# Patient Record
Sex: Male | Born: 1990 | Race: White | Hispanic: No | Marital: Single | Smoking: Never smoker
Health system: Southern US, Community
[De-identification: ages and names within clinical notes are randomized; demographics above are authoritative.]

---

## 2009-02-01 ENCOUNTER — Emergency Department (HOSPITAL_COMMUNITY): Admission: EM | Admit: 2009-02-01 | Discharge: 2009-02-01 | Payer: Self-pay | Admitting: Emergency Medicine

## 2009-11-24 ENCOUNTER — Emergency Department (HOSPITAL_COMMUNITY)
Admission: EM | Admit: 2009-11-24 | Discharge: 2009-11-25 | Payer: Self-pay | Source: Home / Self Care | Admitting: Emergency Medicine

## 2010-07-29 ENCOUNTER — Emergency Department (HOSPITAL_COMMUNITY)
Admission: EM | Admit: 2010-07-29 | Discharge: 2010-07-29 | Payer: Self-pay | Source: Home / Self Care | Admitting: Emergency Medicine

## 2010-11-06 IMAGING — CR DG CHEST 2V
2 series · 2 of 2 positions shown · non-contrast
Comparison: None.

CLINICAL DATA: Chest pain.  Difficulty breathing.

CHEST - 2 VIEW

[w chest pa]
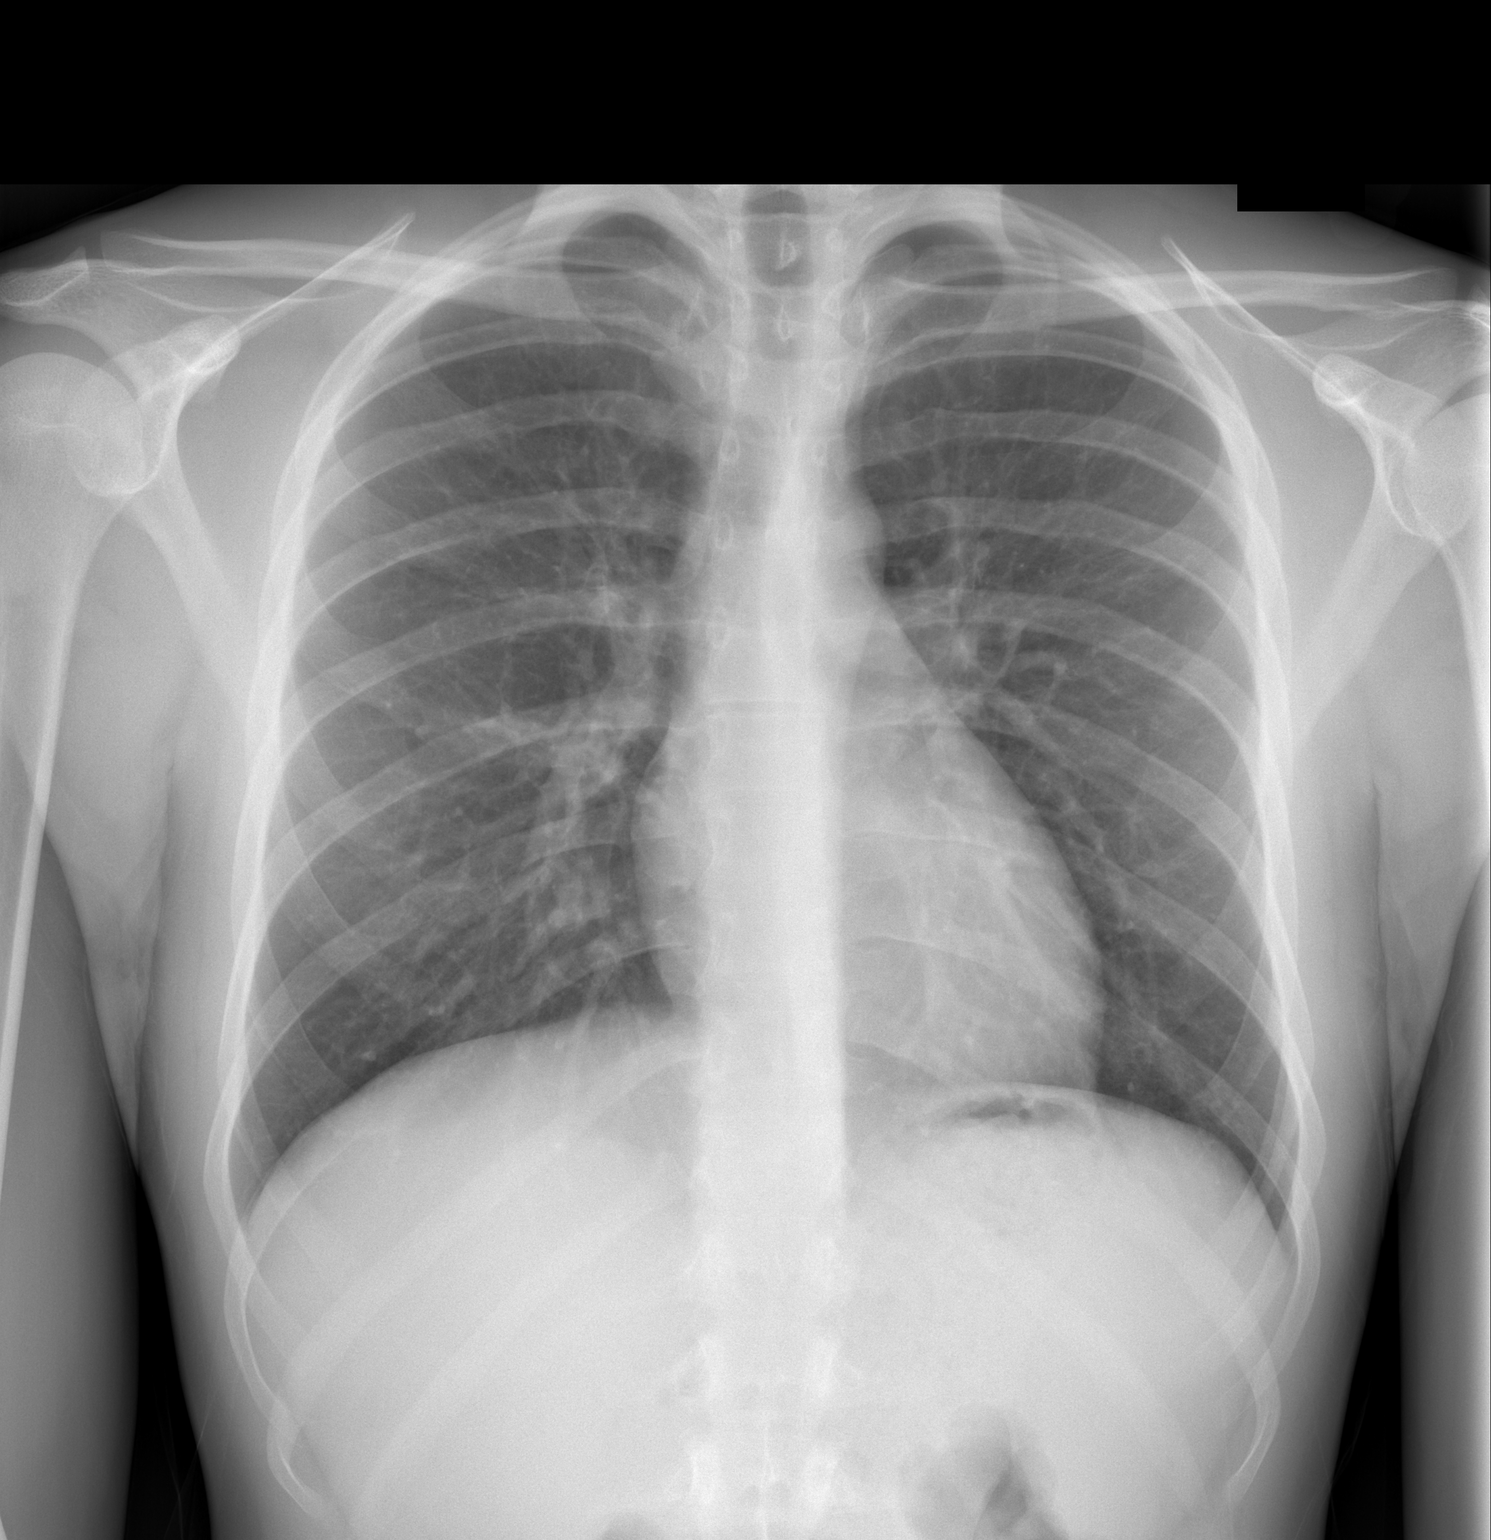

[w chest lat]
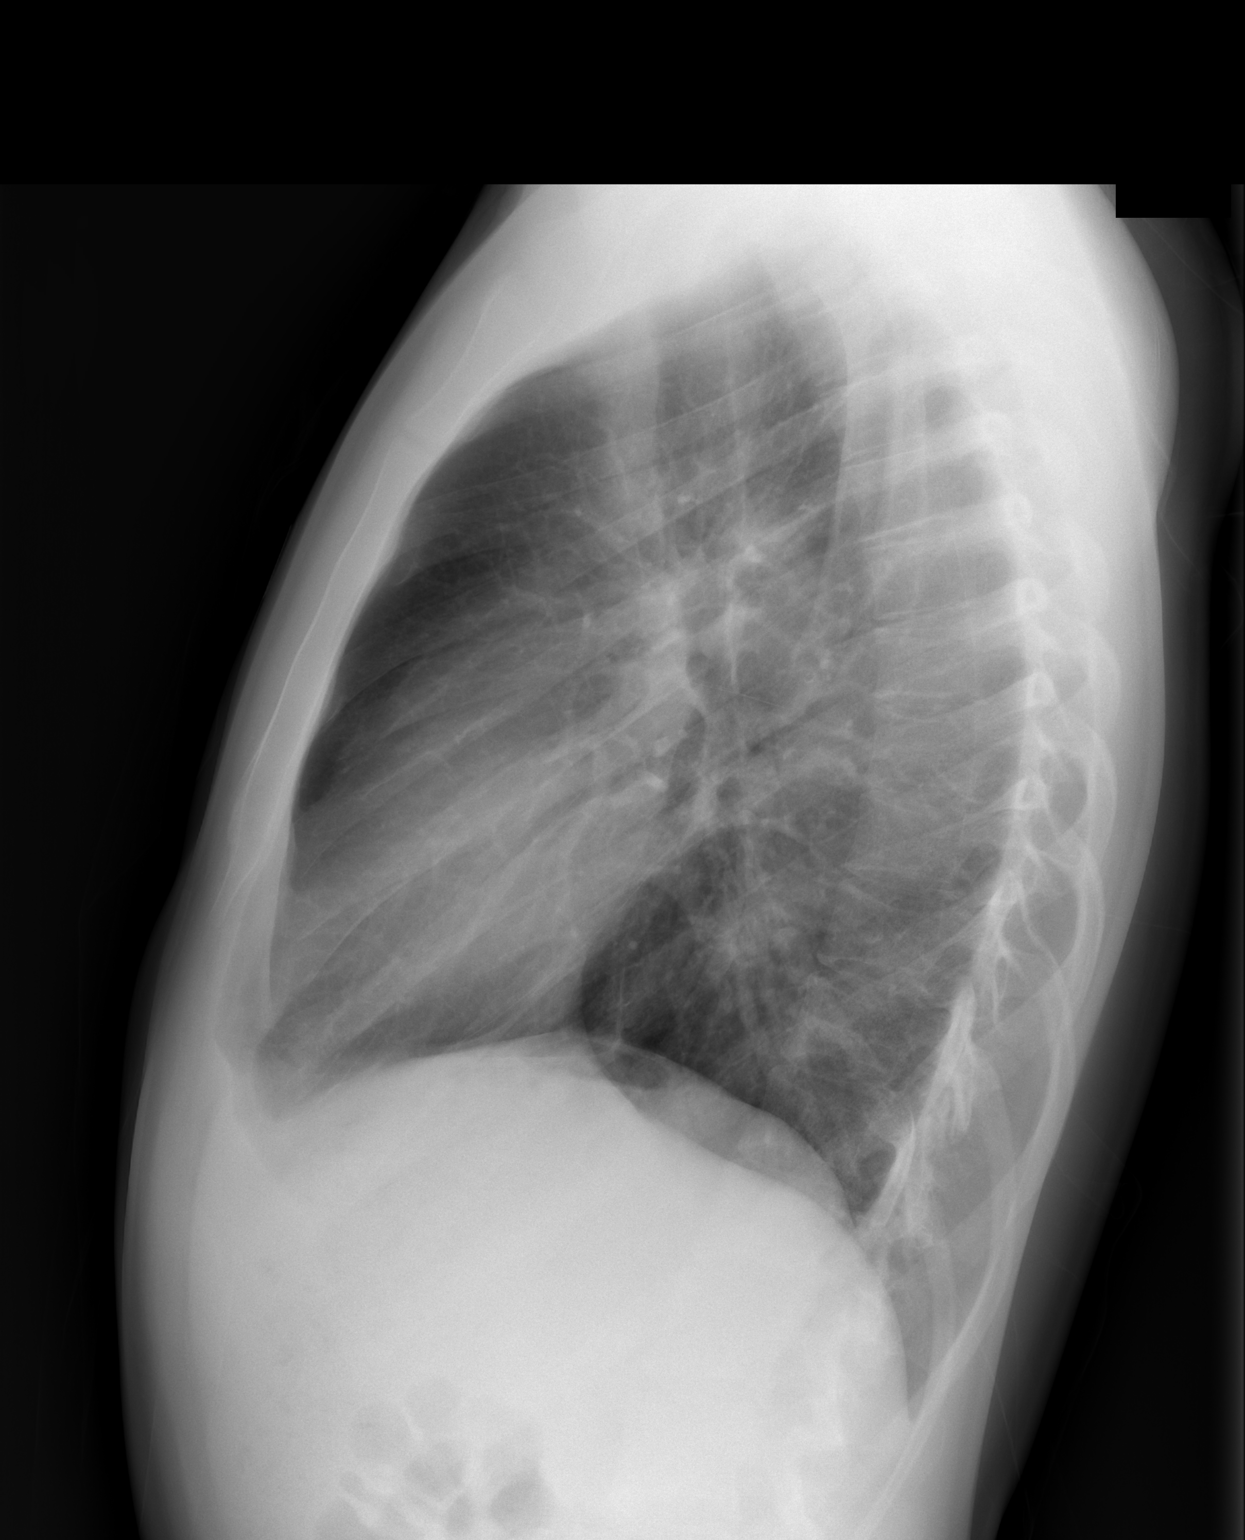

[2 of 2 positions shown; findings below may reference images not displayed]

FINDINGS: Lungs clear.  Cardiopericardial silhouette within normal
limits.  Trachea midline.  No airspace disease or effusion.
IMPRESSION: Normal chest.

## 2023-04-09 ENCOUNTER — Ambulatory Visit (HOSPITAL_COMMUNITY)
Admission: EM | Admit: 2023-04-09 | Discharge: 2023-04-09 | Disposition: A | Discharge status: Home / Self Care | Payer: Medicaid Other | Attending: Family | Admitting: Family

## 2023-04-09 DIAGNOSIS — F4322 Adjustment disorder with anxiety: Secondary | ICD-10-CM | POA: Insufficient documentation

## 2023-04-09 DIAGNOSIS — Z79899 Other long term (current) drug therapy: Secondary | ICD-10-CM | POA: Insufficient documentation

## 2023-04-09 NOTE — Discharge Instructions (Addendum)
Based on the information that you have provided and the presenting issues outpatient services and resources for have been recommended.  It is imperative that you follow through with treatment recommendations within 5-7 days from the of discharge to mitigate further risk to your safety and mental well-being. A list of referrals has been provided below to get you started.  You are not limited to the list provided.  In case of an urgent crisis, you may contact the Mobile Crisis Unit with [facility] at [phone].   Therapists that accept Medicaid in [place], [place]   [facility] [address] [place], [place] [address] [phone]   [facility] [address] [place], [place], [place] [address] [phone]  Eyvonne Mechanic, [facility] [address] [place], [place] [address] [phone]      Psychiatrist that accept Medicaid in [place]  Dr. Dannielle Karvonen [address] [place], [place], [address] [phone]  [facility] Location Address: [address], [place], [place] [phone]  Dr. Beverly Sessions [address] [place], [place] [address] Phone: [phone]    Patient is instructed prior to discharge to:  Take all medications as prescribed by his/her mental healthcare provider. Report any adverse effects and or reactions from the medicines to his/her outpatient provider promptly. Keep all scheduled appointments, to ensure that you are getting refills on time and to avoid any interruption in your medication.  If you are unable to keep an appointment call to reschedule.  Be sure to follow-up with resources and follow-up appointments provided.  Patient has been instructed & cautioned: To not engage in alcohol and or illegal drug use while on prescription medicines. In the event of worsening symptoms, patient is instructed to call the crisis hotline, 911 and or go to the nearest ED for appropriate evaluation and  treatment of symptoms. To follow-up with his/her primary care provider for your other medical issues, concerns and or health care needs.  Information: -National Suicide Prevention Lifeline 1-800-SUICIDE or (978)592-5676.  -988 offers 24/7 access to trained crisis counselors who can help people experiencing mental health-related distress. People can call or text 988 or chat 988lifeline.org for themselves or if they are worried about a loved one who may need crisis support.

## 2023-04-09 NOTE — ED Notes (Signed)
Patient was discharged by the provider. Patient was given an AVS with community resources.  ?

## 2023-04-09 NOTE — Progress Notes (Signed)
   04/09/23 1333  [facility] Triage Screening (Walk-ins at [facility] only)  How Did You Hear About [place]? Self  What Is the Reason for Your Visit/Call Today? Depression; Pt presents to [facility] voluntarily and unaccompanied at this time with complaints of isolation, hopeless, and worthlessness feelings.  Pt reports he move from [place] a month ago, was told by his parents to visit [facility].  Pt denies SI, HI, AVH or Paranoia.  Pt reports smoking marijuana three weeks ago and deneis other substance use.  Pt admits to MH diagnosis; also reports not currently taking prescribed medication.  How Long Has This Been Causing You Problems? 1 wk - 1 month  Have You Recently Had Any Thoughts About Hurting Yourself? No  Are You Planning to Commit Suicide/Harm Yourself At This time? No  Have you Recently Had Thoughts About Hurting Someone Karolee Ohs? No  Are You Planning To Harm Someone At This Time? No  Explanation: n/a  Are you currently experiencing any auditory, visual or other hallucinations? No  Have You Used Any Alcohol or Drugs in the Past 24 Hours? No  What Did You Use and How Much? Pt reports using Marijuana three weeks ago  Do you have any current medical co-morbidities that require immediate attention? No  Clinician description of patient physical appearance/behavior: casual  What Do You Feel Would Help You the Most Today? Treatment for Depression or other mood problem  If access to [facility] was not available, would you have sought care in the Emergency Department? Yes  Determination of Need Routine (7 days)  Options For Referral Outpatient Therapy
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
## 2023-07-23 ENCOUNTER — Emergency Department (HOSPITAL_COMMUNITY)
Admission: EM | Admit: 2023-07-23 | Discharge: 2023-07-23 | Disposition: A | Discharge status: Against Medical Advice | Payer: Medicaid Other | Attending: Emergency Medicine | Admitting: Emergency Medicine

## 2023-07-23 ENCOUNTER — Other Ambulatory Visit: Payer: Self-pay

## 2023-07-23 ENCOUNTER — Emergency Department (HOSPITAL_BASED_OUTPATIENT_CLINIC_OR_DEPARTMENT_OTHER): Payer: Medicaid Other | Admitting: Radiology

## 2023-07-23 ENCOUNTER — Emergency Department (HOSPITAL_BASED_OUTPATIENT_CLINIC_OR_DEPARTMENT_OTHER)
Admission: EM | Admit: 2023-07-23 | Discharge: 2023-07-23 | Disposition: A | Discharge status: Home / Self Care | Payer: Medicaid Other | Source: Home / Self Care | Attending: Emergency Medicine | Admitting: Emergency Medicine

## 2023-07-23 DIAGNOSIS — Z5321 Procedure and treatment not carried out due to patient leaving prior to being seen by health care provider: Secondary | ICD-10-CM | POA: Insufficient documentation

## 2023-07-23 DIAGNOSIS — R222 Localized swelling, mass and lump, trunk: Secondary | ICD-10-CM | POA: Insufficient documentation

## 2023-07-23 NOTE — ED Triage Notes (Signed)
Pt arrives with c/o a growth on his mid chest.

## 2023-07-23 NOTE — Discharge Instructions (Signed)
Thank you for allowing [place] to be a part of your care.  Please call the health connect phone number to set up an appointment with a primary care doctor for follow up.

## 2023-07-23 NOTE — ED Triage Notes (Signed)
Pt POV from home reporting new bump to mid chest that he noticed 2 weeks ago. Redness noted, pt denies pain or any other symptoms.

## 2023-07-23 NOTE — ED Provider Notes (Signed)
[place] EMERGENCY DEPARTMENT AT [facility] Provider Note   CSN: [id number] Arrival date & time: 07/23/23  1502     History  Chief Complaint  Patient presents with   Mass    Darren Melendez is a 32 y.o. male presents to the ED reporting a new mass that he feels at the base of his sternum.  Patient states he first noticed it 2 weeks ago and it was more to the right of the sternum, but has since migrated.  Denies fever, redness, increased warmth, swelling, rashes, or pain associated with the mass.  Denies personal history of cancer.       Home Medications Prior to Admission medications   Not on File      Allergies    Codeine    Review of Systems   Review of Systems  Constitutional:  Negative for fever.  Skin:  Negative for color change and rash.       New palpable mass to chest    Physical Exam Updated Vital Signs BP (!) 129/96   Pulse 85   Temp 98.6 F (37 C) (Oral)   Resp 17   Ht 5\' 6"  (1.676 m)   Wt 77.1 kg   SpO2 100%   BMI 27.44 kg/m  Physical Exam Vitals and nursing note reviewed.  Constitutional:      General: He is not in acute distress.    Appearance: Normal appearance. He is not ill-appearing or diaphoretic.  Cardiovascular:     Rate and Rhythm: Normal rate and regular rhythm.  Pulmonary:     Effort: Pulmonary effort is normal.  Chest:     Chest wall: Mass (small, non tender) present.    Skin:    General: Skin is warm and dry.     Capillary Refill: Capillary refill takes less than 2 seconds.  Neurological:     Mental Status: He is alert. Mental status is at baseline.  Psychiatric:        Mood and Affect: Mood normal.        Behavior: Behavior normal.     ED Results / Procedures / Treatments   Labs (all labs ordered are listed, but only abnormal results are displayed) Labs Reviewed - No data to display  EKG None  Radiology DG Chest 2 View  Result Date: 07/23/2023 CLINICAL DATA:  Chest wall mass. EXAM: CHEST -  2 VIEW COMPARISON:  X-ray 11/24/2009 FINDINGS: No consolidation, pneumothorax or effusion. No edema. Normal cardiopericardial silhouette. No obvious mass by x-ray. Please correlate for the exact location. No marker was placed at the time of the x-ray. If needed additional workup with CT or ultrasound may be useful for further delineation of mass. IMPRESSION: No acute cardiopulmonary disease. Further workup of mass as clinically appropriate Electronically Signed   By: Karen Kays M.D.   On: 07/23/2023 17:59    Procedures Ultrasound ED Soft Tissue  Date/Time: 07/23/2023 11:21 PM  Performed by: Lenard Simmer, PA-C Authorized by: Lenard Simmer, PA-C   Procedure details:    Indications comment:  Evaluate mass   Transverse view:  Visualized   Longitudinal view:  Visualized   Images: not archived   Location:    Location: chest     Side:  Midline Findings:     no abscess present    no cellulitis present Comments:     Unable to visualize significant mass.  No abscess or cyst.      Medications Ordered in ED Medications -  No data to display  ED Course/ Medical Decision Making/ A&P                                 Medical Decision Making Amount and/or Complexity of Data Reviewed Radiology: ordered.   This patient presents to the ED with chief complaint(s) of chest wall mass with non-contributory past medical history.  The complaint involves an extensive differential diagnosis and also carries with it a high risk of complications and morbidity.    The differential diagnosis includes enlarged lymph node, cyst, abscess, lipoma   Initial Assessment:   Exam significant for overall well-appearing patient who is not in acute distress.  Small, nontender mass appreciated to the sternum near the xiphoid process.  No erythema, increased warmth, or other overlying skin changes.    Treatment and Reassessment: Attempted to evaluated mass with bedside ultrasound, no significant mass visualized.   No abscess or cyst.  No pain during examination.   Chest x-ray ordered to evaluate further.  Imaging negative for abnormality.   Disposition:   Advised patient to follow up outpatient with PCP.  Patient provided with Health Connect phone number.  Work note provided.  The patient has been appropriately medically screened and/or stabilized in the ED. I have low suspicion for any other emergent medical condition which would require further screening, evaluation or treatment in the ED or require inpatient management. At time of discharge the patient is hemodynamically stable and in no acute distress. I have discussed work-up results and diagnosis with patient and answered all questions. Patient is agreeable with discharge plan. We discussed strict return precautions for returning to the emergency department and they verbalized understanding.             Final Clinical Impression(s) / ED Diagnoses Final diagnoses:  Mass of chest wall    Rx / DC Orders ED Discharge Orders     None         Lenard Simmer, PA-C 07/23/23 2324    Terrilee Files, MD 07/24/23 1050

## 2023-08-06 ENCOUNTER — Encounter (HOSPITAL_COMMUNITY): Payer: Self-pay

## 2023-08-06 ENCOUNTER — Emergency Department (HOSPITAL_COMMUNITY)
Admission: EM | Admit: 2023-08-06 | Discharge: 2023-08-06 | Discharge status: Against Medical Advice | Payer: Medicaid Other | Attending: Emergency Medicine | Admitting: Emergency Medicine

## 2023-08-06 ENCOUNTER — Other Ambulatory Visit: Payer: Self-pay

## 2023-08-06 DIAGNOSIS — Z5321 Procedure and treatment not carried out due to patient leaving prior to being seen by health care provider: Secondary | ICD-10-CM | POA: Insufficient documentation

## 2023-08-06 DIAGNOSIS — R21 Rash and other nonspecific skin eruption: Secondary | ICD-10-CM | POA: Diagnosis present

## 2023-08-06 NOTE — ED Triage Notes (Signed)
Patient reports a bump on his mid chest and a rash. Has been seen for the bump previously, but the rash is new. Denies pain and fevers.

## 2023-08-06 NOTE — ED Triage Notes (Signed)
Pt LWBS, called x3 in lobby, bathroom, and outside.

## 2023-08-10 ENCOUNTER — Emergency Department (HOSPITAL_COMMUNITY)
Admission: EM | Admit: 2023-08-10 | Discharge: 2023-08-11 | Discharge status: Against Medical Advice | Payer: BC Managed Care – PPO

## 2023-08-10 ENCOUNTER — Encounter (HOSPITAL_COMMUNITY): Payer: Self-pay

## 2023-08-10 ENCOUNTER — Other Ambulatory Visit: Payer: Self-pay

## 2023-08-10 DIAGNOSIS — R222 Localized swelling, mass and lump, trunk: Secondary | ICD-10-CM | POA: Insufficient documentation

## 2023-08-10 DIAGNOSIS — Z09 Encounter for follow-up examination after completed treatment for conditions other than malignant neoplasm: Secondary | ICD-10-CM | POA: Insufficient documentation

## 2023-08-10 NOTE — ED Triage Notes (Signed)
Pt presents via POV c/o "needing CT scan". Pt requesting CT scan and "cancer screening". Reports having a "lump" in epigastric region. Reports seen in ED x2 for same previously. Pt denies pain.

## 2023-08-10 NOTE — ED Notes (Signed)
Pt not in his bed, provider looking for him.

## 2023-08-10 NOTE — Discharge Instructions (Signed)
You were seen in the ER today for concerns of a skin mass. I am unable to see or feel any bump today. Your chest xray and ultrasound from a few weeks ago look reassuring. I would advise that you follow up with your primary care provider or establish care with one for further assessment of your concerns.

## 2023-08-10 NOTE — ED Provider Notes (Signed)
[place] EMERGENCY DEPARTMENT AT [facility] Provider Note   CSN: [id number] Arrival date & time: 08/10/23  4098     History Chief Complaint  Patient presents with   Follow-up    Darren Melendez is a 32 y.o. male.  Patient without significant past medical history presents emergency department concerns of a mass of his chest.  Requesting CT imaging.  Patient was seen on 07/23/2023 for similar concerns and had x-ray imaging and ultrasound performed at that time which did not reveal any abnormal findings.  Also requesting cancer screening.  HPI     Home Medications Prior to Admission medications   Not on File      Allergies    Codeine    Review of Systems   Review of Systems  Skin:        Mass  All other systems reviewed and are negative.   Physical Exam Updated Vital Signs BP (!) 129/93 (BP Location: Left Arm)   Pulse 75   Temp 97.6 F (36.4 C) (Oral)   Resp 17   Ht 5\' 6"  (1.676 m)   Wt 78 kg   SpO2 100%   BMI 27.76 kg/m  Physical Exam Vitals and nursing note reviewed.  Constitutional:      General: He is not in acute distress.    Appearance: He is well-developed.  HENT:     Head: Normocephalic and atraumatic.  Eyes:     Conjunctiva/sclera: Conjunctivae normal.  Cardiovascular:     Rate and Rhythm: Normal rate and regular rhythm.     Heart sounds: No murmur heard. Pulmonary:     Effort: Pulmonary effort is normal. No respiratory distress.     Breath sounds: Normal breath sounds.  Abdominal:     Palpations: Abdomen is soft.     Tenderness: There is no abdominal tenderness.  Musculoskeletal:        General: No swelling.     Cervical back: Neck supple.  Skin:    General: Skin is warm and dry.     Capillary Refill: Capillary refill takes less than 2 seconds.          Comments: Area patient is concerned about does not have any palpable mass and no tenderness in this area.  Neurological:     Mental Status: He is alert.   Psychiatric:        Mood and Affect: Mood normal.     ED Results / Procedures / Treatments   Labs (all labs ordered are listed, but only abnormal results are displayed) Labs Reviewed - No data to display  EKG None  Radiology No results found.  Procedures Procedures   Medications Ordered in ED Medications - No data to display  ED Course/ Medical Decision Making/ A&P                               Medical Decision Making  This patient presents to the ED for concern of skin mass.   Problem List / ED Course:  Patient presents to the ED with concerns of a mass. States that he noticed it several weeks ago randomly. Was seen a few weeks for similar concerns and had xray and US performed without any abnormal findings seen. Today, I am unable to see or feel any mass in this area. What I am able to feel seems to be patient's xyphoid process. No mass is noted. No acute indication for  any other labs or imaging at this time as patient has no other concerns. Given no other concerns, will discharge with discussion for PCP follow up.   Final Clinical Impression(s) / ED Diagnoses Final diagnoses:  Follow-up exam    Rx / DC Orders ED Discharge Orders     None         Salomon Mast 08/10/23 2306    Durwin Glaze, MD 08/11/23 [phone]

## 2023-08-25 ENCOUNTER — Emergency Department (HOSPITAL_BASED_OUTPATIENT_CLINIC_OR_DEPARTMENT_OTHER)
Admission: EM | Admit: 2023-08-25 | Discharge: 2023-08-25 | Disposition: A | Discharge status: Home / Self Care | Payer: Medicaid Other | Attending: Emergency Medicine | Admitting: Emergency Medicine

## 2023-08-25 ENCOUNTER — Other Ambulatory Visit: Payer: Self-pay

## 2023-08-25 DIAGNOSIS — R21 Rash and other nonspecific skin eruption: Secondary | ICD-10-CM | POA: Insufficient documentation

## 2023-08-25 NOTE — Discharge Instructions (Addendum)
You were seen in the ER today for evaluation of your rash on your abdomen. This may be from using your heating pad. Please discontinue use. You will need to follow up with a dermatologist for biopsy and testing of this. Please make sure to follow up with your PCP as well. If you have any concerns, new or worsening symptoms, please return to the ER for re-evaluation.   Contact a health care provider if: You sweat at night more than normal. You pee (urinate) more or less than normal, or your pee is a darker color than normal. Your eyes become sensitive to light. Your skin or the white parts of your eyes turn yellow (jaundice). Your skin tingles or is numb. You get painful blisters in your nose or mouth. Your rash does not go away after a few days, or it gets worse. You are more tired or thirsty than normal. You have new or worse symptoms. These may include: Pain in your abdomen. Fever. Diarrhea or vomiting. Weakness or weight loss. Get help right away if: You get confused. You have a severe headache, a stiff neck, or severe joint pain or stiffness. You become very sleepy or not responsive. You have a seizure.

## 2023-08-25 NOTE — ED Provider Notes (Addendum)
[place] EMERGENCY DEPARTMENT AT [facility] Provider Note   CSN: [id number] Arrival date & time: 08/25/23  1058     History Chief Complaint  Patient presents with   Rash    Darren Melendez is a 32 y.o. male with h/o GAD presents to the ER for evaluation of rash to his abdomen for the past 2-3 days. He denies any pain or itching. Denies any new medications, foods, soaps, or lotions. Denies any fever or trauma to the area. He reports that has has been feeling what he thinks is a "mass" in his chest for over a month. Reports he has seen his PCP for this three times. Denies any chest pain or SOB. Denies any mouth swelling or trouble swallowing. He has been sleeping with a heating pad and using one throughout the day.     Rash Associated symptoms: no abdominal pain, no fever and no shortness of breath        Home Medications Prior to Admission medications   Not on File      Allergies    Codeine    Review of Systems   Review of Systems  Constitutional:  Negative for chills, fever and unexpected weight change.  Respiratory:  Negative for shortness of breath.   Cardiovascular:  Negative for chest pain.  Gastrointestinal:  Negative for abdominal pain.  Skin:  Positive for rash.    Physical Exam Updated Vital Signs BP (!) 122/90 (BP Location: Right Arm)   Pulse 72   Temp 98.2 F (36.8 C)   Resp 16   Ht 5\' 6"  (1.676 m)   Wt 78 kg   SpO2 100%   BMI 27.75 kg/m  Physical Exam Vitals and nursing note reviewed.  Constitutional:      General: He is not in acute distress.    Appearance: He is not ill-appearing or toxic-appearing.  Eyes:     General: No scleral icterus. Pulmonary:     Effort: Pulmonary effort is normal. No respiratory distress.  Chest:     Chest wall: No tenderness.     Comments: Xyphoid process palpated. I do not appreciate a mass.  Abdominal:     Palpations: Abdomen is soft.     Tenderness: There is no abdominal tenderness. There is  no guarding or rebound.  Skin:    General: Skin is warm and dry.     Findings: Rash present.     Comments: Please see image. Areas of flat, hyperpigmented skin. Non tender to palpation. No skin sloughing. No macules or blistering noted.   Neurological:     Mental Status: He is alert.       ED Results / Procedures / Treatments   Labs (all labs ordered are listed, but only abnormal results are displayed) Labs Reviewed - No data to display  EKG None  Radiology No results found.  Procedures Procedures   Medications Ordered in ED Medications - No data to display  ED Course/ Medical Decision Making/ A&P   Medical Decision Making  32 y.o. male presents to the ER today for evaluation of rash. Differential diagnosis includes but is not limited to SJS, TENS, contact dermatitic, allergic dermatitis, urticaria, shingles, RMSF, necrotizing fascitis. Vital signs show BP 122/90, otherwise unremarkable. Physical exam as noted above.   I do not appreciate a chest mass, I feel his xyloid process. The patient reports that he has been seen by his PCP and the ER for this. No fevers or B symptoms. Can continue  to follow up with PCP.   I do not think this rash is SJS or TENs. No skin sloughing or blistering. The patient does not take any daily medications. The rash is not itching or painful. I doubt any allergic reaction or dermatitis. No blisters or vesicles, less likely shingles. No shackled rash or recent tick bite, less likely RMSF. No crepitus or swelling, not consistent with NF. Question if this is Erythema Ab igne from continued use of the heating pad or livitego reticularis. I do not think this is an emergent rash. Discussed that he will need to follow up with dermatology for possible biopsy. Information for a dermatologist given. Discussed with him to stop using the heating pad. Stable for discharge home with outpatient follow up.   We discussed plan at bedside. We discussed strict return  precautions and red flag symptoms. The patient verbalized their understanding and agrees to the plan. The patient is stable and being discharged home in good condition.  Portions of this report may have been transcribed using voice recognition software. Every effort was made to ensure accuracy; however, inadvertent computerized transcription errors may be present.   Final Clinical Impression(s) / ED Diagnoses Final diagnoses:  Rash    Rx / DC Orders ED Discharge Orders     None         Achille Rich, PA-C 08/26/23 1130    Achille Rich, PA-C 08/26/23 1131    Ernie Avena, MD 08/28/23 2032

## 2023-08-25 NOTE — ED Notes (Signed)
Discharge paperwork given and verbally understood. 

## 2023-08-25 NOTE — ED Triage Notes (Signed)
Pt via pov from home with rash to chest that started  3 days ago. Pt states he was recently seen for a "mass" in his epigastric area and this is concerning to him. Pt alert & oriented, somewhat anxious in triage. NAD noted.

## 2023-08-27 ENCOUNTER — Other Ambulatory Visit (HOSPITAL_BASED_OUTPATIENT_CLINIC_OR_DEPARTMENT_OTHER): Payer: Self-pay | Admitting: General Practice

## 2023-08-27 DIAGNOSIS — R222 Localized swelling, mass and lump, trunk: Secondary | ICD-10-CM

## 2023-09-14 ENCOUNTER — Telehealth (HOSPITAL_BASED_OUTPATIENT_CLINIC_OR_DEPARTMENT_OTHER): Payer: Self-pay | Admitting: General Practice

## 2023-09-30 ENCOUNTER — Ambulatory Visit (HOSPITAL_BASED_OUTPATIENT_CLINIC_OR_DEPARTMENT_OTHER): Payer: Medicaid Other | Attending: General Practice

## 2023-11-19 ENCOUNTER — Ambulatory Visit (HOSPITAL_COMMUNITY)
Admission: EM | Admit: 2023-11-19 | Discharge: 2023-11-19 | Disposition: A | Discharge status: Home / Self Care | Payer: 59

## 2023-11-19 DIAGNOSIS — F4322 Adjustment disorder with anxiety: Secondary | ICD-10-CM | POA: Diagnosis not present

## 2023-11-19 NOTE — Discharge Instructions (Addendum)
 Base on the information you have provided and the presenting issue, outpatient services and resources for have been recommended. It is imperative that you follow through with treatment recommendations within 5-7 days from the of discharge to mitigate further risk to your safety and mental well-being. A list of referrals has been provided below to get you started. You are not limited to the list provided. In case of an urgent crisis, you may contact the Mobile Crisis Unit with [facility] at [phone].  You are scheduled an appointment with Nanetta CHRISTELLA Paula Licensed Clinical Mental Health Counselor, [place], [place], [facility] on Monday 11-22-2023 at 2pm in person  [facility], [facility] [address] [place], [place] [address]  [phone]  Are you feeling "some kind of way", unsettled in yourself, or having a difficult time identifying how to navigate through your world. You are not alone. We all encounter times when the life we are living becomes overwhelming and we wish for someone to hear the things we are feeling inside but are unable to express. We are looking to understand why these things are happening to us , and how to feel at peace with ourselves. We want to know that these negative feelings will not always be with us . No, I do not have a magic wand that will solve all your worries.What I offer is the opportunity to listen to your story and help you develop  As a graduate of [employer], with a Masters of arts in counseling psychology, I serve a diverse population while personalizing care. The skills used come from art therapy, play therapy, CBT(Cognitive Behavioral Therapy), EMDR, and other specialized therapeutic interventions. I cater to my client's needs while using best practices. I started my counseling practice with the desire of helping others heal from the conflict they have faced in life and their personal struggles. I have  had the honor of assisting people overcome many of their life struggles and to move forward and enjoy the life that they wish to have. Give me a call and let me know how I can help you.

## 2023-11-19 NOTE — Progress Notes (Signed)
   11/19/23 1223  [facility] Triage Screening (Walk-ins at [facility] only)  How Did You Hear About Us ? Self  What Is the Reason for Your Visit/Call Today? Darren Melendez is a 33 year old male presenting to [facility] unaccompanied. 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 Pt reports that he has been zoning out at work. Pt also reports he has inconsistent moods. Pt does report passive hallucinations a few weeks ago. Pt does report to be taking medication at this time and does find it to be helping his symptoms. Pt reports his sleep patterns are off and on. Sometimes he will sleep well and other times he will not sleep at all.Pt is looking for a therapist at this time. Pt denies substance use, Si, Hi and Avh.  How Long Has This Been Causing You Problems? 1 wk - 1 month  Have You Recently Had Any Thoughts About Hurting Yourself? No  Are You Planning to Commit Suicide/Harm Yourself At This time? No  Have you Recently Had Thoughts About Hurting Someone [name]? No  Are You Planning To Harm Someone At This Time? No  Physical Abuse Denies  Verbal Abuse Denies  Sexual Abuse Denies  Exploitation of patient/patient's resources Denies  Self-Neglect Denies  Possible abuse reported to: Other (Comment)  Are you currently experiencing any auditory, visual or other hallucinations? No  Have You Used Any Alcohol or Drugs in the Past 24 Hours? No  Do you have any current medical co-morbidities that require immediate attention? No  Clinician description of patient physical appearance/behavior: calm, cooperative  What Do You Feel Would Help You the Most Today? Stress Management  If access to [facility] was not available, would you have sought care in the Emergency Department? No  Determination of Need Routine (7 days)  Options For Referral Outpatient Therapy

## 2023-11-19 NOTE — ED Provider Notes (Signed)
 Behavioral Health Urgent Care Medical Screening Exam  Patient Name: Darren Melendez MRN: [id number] Date of Evaluation: 11/19/23 Chief Complaint:   Diagnosis:  Final diagnoses:  Adjustment disorder with anxious mood    History of Present illness: Darren Melendez is a 33 y.o.  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    Patient states that yesterday he had a really bad day and felt a lot of pressure related to a work situation. He states that in that moment he could not think clearly, over thinking, couldn't focus and wasn't able to catch up to speed as usual.   He states that last November he was dx with hypermania and was prescribed medications. He is unable to recall the name of the medication and states that he stopped taking the mediation a 1.5 months ago. He states that he has an appointment with [facility] on Monday, 11/22/23 with [facility] for medication management.   On evaluation, patient denies SI. He denies past suicide attempts. He denies HI. He denies AVH. He reports auditory hallucinations two days ago. There is no objective evidence that the patient is responding to internal or external stimuli. He denies drinking alcohol or using illicit drugs.   Plan of care: Patient scheduled a therapy appointment for Monday, 11/21/22 in person with [facility]. Patient to follow up with [facility] on Monday, 11/22/23 virtually for medication management scheduled by the patient. Safety planning completed prior to discharge.   Flowsheet Row ED from 11/19/2023 in [facility] ED from 08/10/2023 in [facility] Emergency Department at [facility] ED from 08/06/2023 in [facility] Emergency Department at [facility]  C-SSRS  RISK CATEGORY No Risk No Risk No Risk       Psychiatric Specialty Exam  Presentation  General Appearance:Appropriate for Environment  Eye Contact:Fair  Speech:Clear and Coherent  Speech Volume:Normal  Handedness:Right   Mood and Affect  Mood: Anxious  Affect: Congruent   Thought Process  Thought Processes: Coherent  Descriptions of Associations:Intact  Orientation:Full (Time, Place and Person)  Thought Content:Logical    Hallucinations:None  Ideas of Reference:None  Suicidal Thoughts:No  Homicidal Thoughts:No   Sensorium  Memory: Immediate Fair; Remote Fair; Recent Fair  Judgment: Fair  Insight: Fair   Art Therapist  Concentration: Fair  Attention Span: Fair  Recall: [employer] of Knowledge: Fair  Language: Fair   Psychomotor Activity  Psychomotor Activity: Normal   Assets  Assets: Manufacturing Systems Engineer; Desire for Improvement; Housing; Physical Health; Leisure Time; Transportation; Vocational/Educational   Sleep  Sleep: Fair  Number of hours:  8   Physical Exam: Physical Exam Cardiovascular:     Rate and Rhythm: Normal rate.  Pulmonary:     Effort: Pulmonary effort is normal.  Musculoskeletal:        General: Normal range of motion.     Cervical back: Normal range of motion.  Neurological:     Mental Status: He is alert and oriented to person, place, and time.    Review of Systems  Constitutional: Negative.   HENT: Negative.    Eyes: Negative.   Respiratory: Negative.    Cardiovascular: Negative.   Gastrointestinal: Negative.   Genitourinary: Negative.   Musculoskeletal: Negative.   Neurological: Negative.   Endo/Heme/Allergies: Negative.   Psychiatric/Behavioral:  Positive for depression. The patient is  nervous/anxious.    Blood pressure 132/77, pulse 77, resp. rate 20, SpO2 100%. There is no height or weight on file to calculate BMI.  Musculoskeletal: Strength & Muscle Tone: within normal  limits Gait & Station: normal Patient leans: N/A   [facility] MSE Discharge Disposition for Follow up and Recommendations: Based on my evaluation the patient does not appear to have an emergency medical condition and can be discharged with resources and follow up care in outpatient services for Medication Management, Individual Therapy, and Group Therapy  Base on the information you have provided and the presenting issue, outpatient services and resources for have been recommended. It is imperative that you follow through with treatment recommendations within 5-7 days from the of discharge to mitigate further risk to your safety and mental well-being. A list of referrals has been provided below to get you started. You are not limited to the list provided. In case of an urgent crisis, you may contact the Mobile Crisis Unit with [facility] at [phone].  You are scheduled an appointment with Nanetta CHRISTELLA Paula Licensed Clinical Mental Health Counselor, [place], [place], [facility] on Monday 11-22-2023 at 2pm in person  [facility], [facility] [address] [place], [place] [address]  [phone]  Are you feeling "some kind of way", unsettled in yourself, or having a difficult time identifying how to navigate through your world. You are not alone. We all encounter times when the life we are living becomes overwhelming and we wish for someone to hear the things we are feeling inside but are unable to express. We are looking to understand why these things are happening to us , and how to feel at peace with ourselves. We want to know that these negative feelings will not always be with us . No, I do not have a magic wand that will solve all your worries.What I offer is the opportunity to listen to your story and help you develop  As a graduate of [employer], with a Masters of arts in counseling psychology, I serve a diverse population while personalizing  care. The skills used come from art therapy, play therapy, CBT(Cognitive Behavioral Therapy), EMDR, and other specialized therapeutic interventions. I cater to my client's needs while using best practices. I started my counseling practice with the desire of helping others heal from the conflict they have faced in life and their personal struggles. I have had the honor of assisting people overcome many of their life struggles and to move forward and enjoy the life that they wish to have. Give me a call and let me know how I can help you.   Baili Stang L, NP 11/19/2023, 2:10 PM

## 2023-11-19 NOTE — ED Notes (Signed)
 Patient d/c in stable condition with all belongings. Patient denies SI, HI, AVH. Patient does not appear to be responding to internal stimuli.

## 2024-04-24 NOTE — ED Notes (Signed)
 Patient Advocate observed patient lying in bed resting quietly.    Darren Melendez 04/24/24 1507

## 2024-04-26 NOTE — Progress Notes (Signed)
 Assessment and Plan  1. Hypothyroidism, unspecified type (Primary) -     TSH+Free T4; Future 2. Sexual behavior with high risk of exposure to communicable disease -     HIV-1 and HIV-2 antibodies; Future -     Hepatitis C Virus Antibody rflx Qnt Real-time PCR; Future -     Syphilis: RPR With Reflex to RPR Titer and Treponemal Ab; Future; Expected date: 04/26/2024 -     CT, GC, TV, & M Gen (rfx resist), NAA urine; Future    Assessment & Plan 1. Hypothyroidism. - TSH and free T4 tests will be conducted to evaluate the effectiveness of the current treatment plan. - Current thyroid medication dosage is 75 mcg. - Discussed the need for follow-up on thyroid levels.   2. High-risk sexual behavior. - Comprehensive STD screening will be performed. - Tests include hepatitis C with a reflex to a quantitative PCR, HIV 1 and 2 antibodies, RPR with a reflex to confirmation, and urine tests for gonorrhea and chlamydia. - Patient requested the full panel of STD screening. - Counseling provided on the importance of regular STD screening.     Follow up in about 3 months (around 07/27/2024) for medication check.   Risks, benefits, and alternatives of the medications and treatment plan prescribed today were discussed, and patient expressed understanding. Plan follow-up as discussed or as needed if any worsening symptoms or change in condition.    Digital scribe technology was used in creating this visit note. Verbal consent from the patient/caregiver was obtained prior to its use.        Subjective  Patient ID:  Darren Melendez is a 33 y.o. (DOB 1991-07-06) male    Patient presents with  . medication f/u    Here needing yearly STD check     History of Present Illness The patient is a 33 year old male here for follow-up of his daytime somnolence, hypothyroidism, and high-risk sexual behavior.  He has been experiencing daytime sleepiness.  He is currently on a regimen of 75 mg of  thyroid medication.  He is seeking comprehensive blood work to monitor his white blood cell count and other parameters. Additionally, he wishes to undergo an STD screening to ensure the continued appropriateness of his medication.  He recently visited an urgent care facility due to chest tightness, although he did not experience any shortness of breath. The medical staff informed him that his blood work was normal, but they did not share the results of his x-ray.  He reports that the IV insertion was painful and left a mark, and he experienced difficulty moving his arm afterward. However, he no longer feels pain at the site, only a slight discomfort.  Social History: Sexual Practices: High-risk sexual behavior     Current Outpatient Medications  Medication Instructions  . ARIPiprazole (ABILIFY) 5 mg, Daily  . clindamycin (CLEOCIN T) 1% SWAB swab SMARTSIG:Topical Twice Daily  . doxycycline hyclate (VIBRAMYCIN) 100 mg, 2 times a day  . emtricitabine-tenofovir (TRUVADA) 200-300 mg per tablet 1 tablet, Daily  . levothyroxine sodium (SYNTHROID,LEVOTHROID,LEVOXYL) 75 mcg, Oral, Every morning      Reviewed and updated this visit by provider: Tobacco  Allergies  Meds  Problems  Med Hx  Surg Hx  Fam Hx         Review of Systems     Objective   Vitals:   04/26/24 1010  BP: 110/80  Patient Position: Sitting  Pulse: 108  Weight: 172 lb (78 kg)  SpO2: 98%  PainSc: 0-No pain    Results      Physical Exam Constitutional:      General: He is not in acute distress.    Appearance: Normal appearance. He is well-developed, well-groomed and normal weight.  HENT:     Head: Normocephalic and atraumatic.  Pulmonary:     Effort: Pulmonary effort is normal.  Neurological:     General: No focal deficit present.     Mental Status: He is alert and oriented to person, place, and time.   Psychiatric:        Mood and Affect: Mood normal.        Thought Content: Thought content  normal.     Physical Exam Extremities: No swelling or tenderness at the IV site.

## 2024-05-07 ENCOUNTER — Ambulatory Visit (HOSPITAL_COMMUNITY)
Admission: EM | Admit: 2024-05-07 | Discharge: 2024-05-07 | Disposition: A | Discharge status: Home / Self Care | Attending: Behavioral Health | Admitting: Behavioral Health

## 2024-05-07 DIAGNOSIS — F4321 Adjustment disorder with depressed mood: Secondary | ICD-10-CM | POA: Insufficient documentation

## 2024-05-07 DIAGNOSIS — Z5689 Other problems related to employment: Secondary | ICD-10-CM | POA: Insufficient documentation

## 2024-05-07 DIAGNOSIS — R000 Tachycardia, unspecified: Secondary | ICD-10-CM | POA: Insufficient documentation

## 2024-05-07 DIAGNOSIS — Z653 Problems related to other legal circumstances: Secondary | ICD-10-CM | POA: Insufficient documentation

## 2024-05-07 DIAGNOSIS — Z599 Problem related to housing and economic circumstances, unspecified: Secondary | ICD-10-CM | POA: Insufficient documentation

## 2024-05-07 NOTE — Discharge Instructions (Addendum)
 Discharge recommendations:   Outpatient Follow up: Please review list of outpatient resources for psychiatry and counseling. Please follow up with your primary care provider for all medical related needs.   [facility] [address] [place],  [place] [address] Hours: Mon-Fri: 8AM to 5PM Phone: [phone]  [facility] is a non-profit organization established to provide comprehensive behavioral healthcare services as defined by those in the community in need of mental health or substance abuse treatment options. All of our staff are highly qualified individuals with a plethora of knowledge to suit individual care as needed. Each of our facilities has a core set of of service options available, however; some centers offer various group meetings or community outreach programs according to the service area. Please contact the center for schedules or further information as these programs and meetings vary and often change depending upon attendance and counselor availability.  Adult Services Advanced Access Walk-In Assessments - All Disabilities Routine Outpatient Therapy Psychiatry/Med Management Substance Abuse Intensive Outpatient (SAIOP) Mobile Crisis DWI Evaluation and Treatment Therapy: We recommend that patient participate in individual therapy to address mental health concerns.  Safety:   The following safety precautions should be taken:   No sharp objects. This includes scissors, razors, scrapers, and putty knives.   Chemicals should be removed and locked up.   Medications should be removed and locked up.   Weapons should be removed and locked up. This includes firearms, knives and instruments that can be used to cause injury.   The patient should abstain from use of illicit substances/drugs and abuse of any medications.  If symptoms worsen or do not continue to improve or if the patient becomes actively suicidal or homicidal then it is recommended  that the patient return to the closest hospital emergency department, the [facility], or call 911 for further evaluation and treatment. National Suicide Prevention Lifeline 1-800-SUICIDE or (417) 786-9735.  About 988 988 offers 24/7 access to trained crisis counselors who can help people experiencing mental health-related distress. People can call or text 988 or chat 988lifeline.org for themselves or if they are worried about a loved one who may need crisis support.   Please contact one of the following facilities to start medication management and therapy services:   [facility] [address]  [place], [place] [address] [phone]   [facility]  [address] [place], [place] [address] [phone]  [facility] [place]  [address] [place], [place] [address] [phone]  [facility]  [address]  [place], [place] [address] [phone]  [facility]  [address] [place], [place] [address] [phone]  [facility]  [address] [place]  [place], [place] [address] [phone]  [facility] Address: [address] [place], [place], [place] [address] Phone: [phone] Services: Specialize in outpatient and substance abuse therapy for couples, family and children  [facility] Address: [address], [place], [place] [address] Phone: [phone]  [facility] Address: [address], [place], [place] [address] Phone: [phone]
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
## 2024-05-09 ENCOUNTER — Telehealth: Payer: Self-pay | Admitting: Family Medicine

## 2024-05-09 ENCOUNTER — Ambulatory Visit (HOSPITAL_COMMUNITY)
Admission: EM | Admit: 2024-05-09 | Discharge: 2024-05-09 | Disposition: A | Discharge status: Home / Self Care | Attending: Psychiatry | Admitting: Psychiatry

## 2024-05-09 DIAGNOSIS — R45851 Suicidal ideations: Secondary | ICD-10-CM | POA: Insufficient documentation

## 2024-05-09 DIAGNOSIS — F319 Bipolar disorder, unspecified: Secondary | ICD-10-CM | POA: Insufficient documentation

## 2024-05-09 DIAGNOSIS — Z653 Problems related to other legal circumstances: Secondary | ICD-10-CM | POA: Insufficient documentation

## 2024-05-09 DIAGNOSIS — R400 Somnolence: Secondary | ICD-10-CM | POA: Insufficient documentation

## 2024-05-09 DIAGNOSIS — F4323 Adjustment disorder with mixed anxiety and depressed mood: Secondary | ICD-10-CM | POA: Insufficient documentation

## 2024-05-09 DIAGNOSIS — F411 Generalized anxiety disorder: Secondary | ICD-10-CM | POA: Insufficient documentation

## 2024-05-09 DIAGNOSIS — F4321 Adjustment disorder with depressed mood: Secondary | ICD-10-CM | POA: Insufficient documentation

## 2024-05-09 DIAGNOSIS — F332 Major depressive disorder, recurrent severe without psychotic features: Secondary | ICD-10-CM

## 2024-05-09 DIAGNOSIS — E039 Hypothyroidism, unspecified: Secondary | ICD-10-CM | POA: Insufficient documentation

## 2024-05-09 DIAGNOSIS — Z9152 Personal history of nonsuicidal self-harm: Secondary | ICD-10-CM | POA: Insufficient documentation

## 2024-05-09 DIAGNOSIS — Z62812 Personal history of neglect in childhood: Secondary | ICD-10-CM | POA: Insufficient documentation

## 2024-05-09 NOTE — Progress Notes (Signed)
   05/09/24 1121  [place] Suicide Severity Rating Scale  1. In the past month -  Have you wished you were dead or wished you could go to sleep and not wake up? Yes  2. In the past month - Have you actually had any thoughts of killing yourself? No  6. Have you ever done anything, started to do anything, or prepared to do anything to end your life? No  C-SSRS RISK CATEGORY Low Risk  Patient location: [facility] Urgent Care/Facility Based Crisis Center  BH Urgent Care/Facility Based Crisis Center Suicide Precautions Interventions  BHUC/FBC Suicide Precautions Interventions Low Risk Interventions

## 2024-05-09 NOTE — Telephone Encounter (Signed)
 I returned the call to the patient: [phone] and had to leave a message.  He is not a patient at [facility] and this message may have been sent in error.  I left my call back number but advised him to contact his PCP office.

## 2024-05-09 NOTE — Discharge Instructions (Addendum)
 Please follow up with your providers regarding your medical needs, continue taking your medications as prescribed, and adhere to the plan of care established by your provider. We discussed following up with outpatient resources regarding his mental health care.  We also discussed following up with outpatient resources related to shelter options.      Get help right away if: You have thoughts about hurting yourself or others. Get help right away if you feel like you may hurt yourself or others, or have thoughts about taking your own life. Go to your nearest emergency room or: Call 911. Call the National Suicide Prevention Lifeline at 781-447-4896 or 988 in the [place] This is open 24 hours a day. If you're a Veteran: Call 988 and press 1. This is open 24 hours a day. Text the [employer] at (930)689-5027. Summary Mental health is not just the absence of mental illness. It involves understanding your emotions and behaviors, and taking steps to manage them in a healthy way. If you have symptoms of mental or emotional distress, get help from family, friends, a health care provider, or a mental health professional. Practice good mental health behaviors such as stress management skills, self-calming skills, exercise, healthy sleeping and eating, and supportive relationships. This information is not intended to replace advice given to you by your health care provider. Make sure you discuss any questions you have with your health care provider.  Education provided on the fact that if experiencing worsening of psychiatry symptoms including suicidal ideations, homicidal ideations, or having auditory/visual hallucinations, etc, to call 911, 988, come back to this location, or go to the nearest ER. Pt verbalized understanding.
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
## 2024-05-09 NOTE — Telephone Encounter (Signed)
 Noted,  Thank you!

## 2024-05-09 NOTE — Telephone Encounter (Signed)
 Copied from CRM [id number]. Topic: General - Other >> May 09, 2024  1:56 PM Rosaria E wrote:  Reason for CRM: Pt called requesting to speak to the social worker about housing information. Wants to speak to Slater Paterson contact: [phone]

## 2024-05-29 ENCOUNTER — Emergency Department (HOSPITAL_COMMUNITY)

## 2024-05-29 ENCOUNTER — Other Ambulatory Visit: Payer: Self-pay

## 2024-05-29 ENCOUNTER — Emergency Department (HOSPITAL_COMMUNITY)
Admission: EM | Admit: 2024-05-29 | Discharge: 2024-05-29 | Disposition: A | Discharge status: Home / Self Care | Attending: Emergency Medicine | Admitting: Emergency Medicine

## 2024-05-29 DIAGNOSIS — R222 Localized swelling, mass and lump, trunk: Secondary | ICD-10-CM | POA: Diagnosis present

## 2024-05-29 DIAGNOSIS — R29898 Other symptoms and signs involving the musculoskeletal system: Secondary | ICD-10-CM

## 2024-05-29 NOTE — ED Triage Notes (Signed)
 Patient to ED by POV from home for imaging. Per patient he was seen at [facility] and was told he needed a CT oh head and chest for lump to mid chest. He states he has had lump for a year. He denies SOB, respiratory distress chest discomfort

## 2024-05-29 NOTE — ED Provider Notes (Signed)
 [place] EMERGENCY DEPARTMENT AT [facility] Provider Note   CSN: [id number] Arrival date & time: 05/29/24  1044     Patient presents with: Abscess   Darren Melendez is a 33 y.o. male.   33 year old male presents for evaluation.  Patient reports that he needs a CT scan.  When queried as to why he needs a CT scan he reports that he has a lump on his chest.  This lump has been there for at least a year.  He denies any pain.  He reports that the lump has gotten smaller.  He has been told by his PCP and providers at [facility] that he does not need a CT scan.  He decided to come to this facility to request imaging.  He appears to have identified his xiphoid process.  He is concerned that this is abnormal.  The history is provided by the patient and medical records.       Prior to Admission medications   Not on File    Allergies: Codeine    Review of Systems  All other systems reviewed and are negative.   Updated Vital Signs BP (!) 144/100   Pulse (!) 115   Temp 98.1 F (36.7 C)   Resp 16   Ht 5' 6 (1.676 m)   Wt 76.7 kg   SpO2 100%   BMI 27.28 kg/m   Physical Exam Vitals and nursing note reviewed.  Constitutional:      General: He is not in acute distress.    Appearance: Normal appearance. He is well-developed.  HENT:     Head: Normocephalic and atraumatic.  Eyes:     Conjunctiva/sclera: Conjunctivae normal.     Pupils: Pupils are equal, round, and reactive to light.  Cardiovascular:     Rate and Rhythm: Normal rate and regular rhythm.     Heart sounds: Normal heart sounds.     Comments: Patient has identified his xiphoid process.  This is the lump that he is concerned about. Pulmonary:     Effort: Pulmonary effort is normal. No respiratory distress.     Breath sounds: Normal breath sounds.  Abdominal:     General: There is no distension.     Palpations: Abdomen is soft.     Tenderness: There is no abdominal tenderness.   Musculoskeletal:        General: No deformity. Normal range of motion.     Cervical back: Normal range of motion and neck supple.  Skin:    General: Skin is warm and dry.  Neurological:     General: No focal deficit present.     Mental Status: He is alert and oriented to person, place, and time.     (all labs ordered are listed, but only abnormal results are displayed) Labs Reviewed - No data to display  EKG: None  Radiology: No results found.   Procedures   Medications Ordered in the ED - No data to display                                  Medical Decision Making Patient request CT chest or chest x-ray imaging.  He has apparently identified his xiphoid process.  He is concerned that this is abnormal.  Despite reassurances that his exam is benign and that it is normal to be able to feel your xiphoid process he is persistent in need for  imaging.  Chest x-ray does not show abnormality.  CT chest is without acute abnormality.  Patient is appropriate for discharge.  Importance of close follow-up was stressed.  Amount and/or Complexity of Data Reviewed Radiology: ordered.        Final diagnoses:  Prominent xiphoid    ED Discharge Orders     None          Laurice Maude BROCKS, MD 05/29/24 1328

## 2024-05-29 NOTE — Discharge Instructions (Signed)
 Return for any problem.  As discussed, the  lump that you are feeling is your xiphoid process.  On imaging - both chest x-ray and CT chest - there is no abnormality identified.  The xiphoid process is a normal part of your anatomy.

## 2024-07-06 ENCOUNTER — Emergency Department (HOSPITAL_COMMUNITY)

## 2024-07-06 ENCOUNTER — Other Ambulatory Visit: Payer: Self-pay

## 2024-07-06 ENCOUNTER — Emergency Department (HOSPITAL_COMMUNITY)
Admission: EM | Admit: 2024-07-06 | Discharge: 2024-07-06 | Discharge status: Against Medical Advice | Attending: Emergency Medicine | Admitting: Emergency Medicine

## 2024-07-06 DIAGNOSIS — Z5321 Procedure and treatment not carried out due to patient leaving prior to being seen by health care provider: Secondary | ICD-10-CM | POA: Insufficient documentation

## 2024-07-06 DIAGNOSIS — R0789 Other chest pain: Secondary | ICD-10-CM | POA: Diagnosis present

## 2024-07-06 DIAGNOSIS — M542 Cervicalgia: Secondary | ICD-10-CM | POA: Diagnosis not present

## 2024-07-06 DIAGNOSIS — R070 Pain in throat: Secondary | ICD-10-CM | POA: Insufficient documentation

## 2024-07-06 NOTE — ED Provider Triage Note (Cosign Needed)
 Emergency Medicine Provider Triage Evaluation Note  Darren Melendez , a 32 y.o. male  was evaluated in triage.  Pt complains of chest pain and feeling like there is something stuck in his throat.  Review of Systems  Positive: Chest pain, throat pain Negative: Shortness of breath, fever, chills, abdominal pain  Physical Exam  BP (!) 134/101 (BP Location: Left Arm)   Pulse 100   Temp 98.3 F (36.8 C) (Oral)   Resp 16   Wt 74.8 kg   SpO2 100%   BMI 26.63 kg/m  Gen:   Awake, no distress, mild anxiety HEENT: Tenderness to right side below jaw line. No obvious deformity Resp:  Normal effort, tachycardic per monitor, peripheral pulses equal bilaterally MSK:   Moves extremities without difficulty  Medical Decision Making  Medically screening exam initiated at 3:16 PM.  Appropriate orders placed.  Darren Melendez was informed that the remainder of the evaluation will be completed by another provider, this initial triage assessment does not replace that evaluation, and the importance of remaining in the ED until their evaluation is complete.    Darren Melendez, [place] 07/06/24 1519
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
## 2024-08-06 ENCOUNTER — Ambulatory Visit (HOSPITAL_COMMUNITY): Admission: EM | Admit: 2024-08-06 | Discharge: 2024-08-07 | Disposition: A | Discharge status: Home / Self Care

## 2024-08-06 DIAGNOSIS — Z9151 Personal history of suicidal behavior: Secondary | ICD-10-CM | POA: Diagnosis not present

## 2024-08-06 DIAGNOSIS — Z5901 Sheltered homelessness: Secondary | ICD-10-CM | POA: Insufficient documentation

## 2024-08-06 DIAGNOSIS — R45851 Suicidal ideations: Secondary | ICD-10-CM | POA: Insufficient documentation

## 2024-08-06 DIAGNOSIS — F4323 Adjustment disorder with mixed anxiety and depressed mood: Secondary | ICD-10-CM | POA: Diagnosis not present

## 2024-08-06 DIAGNOSIS — F1210 Cannabis abuse, uncomplicated: Secondary | ICD-10-CM | POA: Insufficient documentation

## 2024-08-06 DIAGNOSIS — F331 Major depressive disorder, recurrent, moderate: Secondary | ICD-10-CM | POA: Diagnosis not present

## 2024-08-06 DIAGNOSIS — E039 Hypothyroidism, unspecified: Secondary | ICD-10-CM | POA: Insufficient documentation

## 2024-08-06 DIAGNOSIS — Z560 Unemployment, unspecified: Secondary | ICD-10-CM | POA: Insufficient documentation

## 2024-08-06 DIAGNOSIS — F4321 Adjustment disorder with depressed mood: Secondary | ICD-10-CM

## 2024-08-06 DIAGNOSIS — B20 Human immunodeficiency virus [HIV] disease: Secondary | ICD-10-CM | POA: Diagnosis present

## 2024-08-06 DIAGNOSIS — F411 Generalized anxiety disorder: Secondary | ICD-10-CM | POA: Insufficient documentation

## 2024-08-06 DIAGNOSIS — Z79890 Hormone replacement therapy (postmenopausal): Secondary | ICD-10-CM | POA: Insufficient documentation

## 2024-08-06 DIAGNOSIS — Z79899 Other long term (current) drug therapy: Secondary | ICD-10-CM | POA: Insufficient documentation

## 2024-08-06 DIAGNOSIS — Z79624 Long term (current) use of inhibitors of nucleotide synthesis: Secondary | ICD-10-CM | POA: Insufficient documentation

## 2024-08-06 DIAGNOSIS — Z658 Other specified problems related to psychosocial circumstances: Secondary | ICD-10-CM | POA: Insufficient documentation

## 2024-08-06 LAB — COMPREHENSIVE METABOLIC PANEL WITH GFR
ALT: 37 U/L (ref 0–44)
AST: 24 U/L (ref 15–41)
Albumin: 4.1 g/dL (ref 3.5–5.0)
Alkaline Phosphatase: 99 U/L (ref 38–126)
Anion gap: 8 (ref 5–15)
BUN: 11 mg/dL (ref 6–20)
CO2: 29 mmol/L (ref 22–32)
Calcium: 9.8 mg/dL (ref 8.9–10.3)
Chloride: 100 mmol/L (ref 98–111)
Creatinine, Ser: 0.89 mg/dL (ref 0.61–1.24)
GFR, Estimated: >60 mL/min (ref >60)
Glucose, Bld: 80 mg/dL (ref 70–99)
Potassium: 4.3 mmol/L (ref 3.5–5.1)
Sodium: 137 mmol/L (ref 135–145)
Total Bilirubin: 0.4 mg/dL (ref 0.0–1.2)
Total Protein: 7.8 g/dL (ref 6.5–8.1)

## 2024-08-06 LAB — URINALYSIS, ROUTINE W REFLEX MICROSCOPIC
Bilirubin Urine: NEGATIVE
Glucose, UA: NEGATIVE mg/dL
Hgb urine dipstick: NEGATIVE
Ketones, ur: NEGATIVE mg/dL
Leukocytes,Ua: NEGATIVE
Nitrite: NEGATIVE
Protein, ur: NEGATIVE mg/dL
Specific Gravity, Urine: 1.02 (ref 1.005–1.030)
pH: 6 (ref 5.0–8.0)

## 2024-08-06 LAB — CBC WITH DIFFERENTIAL/PLATELET
Abs Immature Granulocytes: 0.04 K/uL (ref 0.00–0.07)
Basophils Absolute: 0.1 K/uL (ref 0.0–0.1)
Basophils Relative: 1 %
Eosinophils Absolute: 0.1 K/uL (ref 0.0–0.5)
Eosinophils Relative: 1 %
HCT: 51.1 % (ref 39.0–52.0)
Hemoglobin: 16.8 g/dL (ref 13.0–17.0)
Immature Granulocytes: 0 %
Lymphocytes Relative: 24 %
Lymphs Abs: 2.3 K/uL (ref 0.7–4.0)
MCH: 30.7 pg (ref 26.0–34.0)
MCHC: 32.9 g/dL (ref 30.0–36.0)
MCV: 93.4 fL (ref 80.0–100.0)
Monocytes Absolute: 0.6 K/uL (ref 0.1–1.0)
Monocytes Relative: 7 %
Neutro Abs: 6.4 K/uL (ref 1.7–7.7)
Neutrophils Relative %: 67 %
Platelets: 273 K/uL (ref 150–400)
RBC: 5.47 MIL/uL (ref 4.22–5.81)
RDW: 12.4 % (ref 11.5–15.5)
WBC: 9.5 K/uL (ref 4.0–10.5)
nRBC: 0 % (ref 0.0–0.2)

## 2024-08-06 LAB — POCT URINE DRUG SCREEN - MANUAL ENTRY (I-SCREEN)
POC Amphetamine UR: POSITIVE — AB
POC Buprenorphine (BUP): NOT DETECTED
POC Cocaine UR: NOT DETECTED
POC Marijuana UR: NOT DETECTED
POC Methadone UR: NOT DETECTED
POC Methamphetamine UR: POSITIVE — AB
POC Morphine: NOT DETECTED
POC Oxazepam (BZO): NOT DETECTED
POC Oxycodone UR: NOT DETECTED
POC Secobarbital (BAR): NOT DETECTED

## 2024-08-06 LAB — HEMOGLOBIN A1C
Hgb A1c MFr Bld: 5 % (ref 4.8–5.6)
Mean Plasma Glucose: 96.8 mg/dL

## 2024-08-06 LAB — LIPID PANEL
Cholesterol: 154 mg/dL (ref 0–200)
HDL: 54 mg/dL (ref >40)
LDL Cholesterol: 81 mg/dL (ref 0–99)
Total CHOL/HDL Ratio: 2.9 ratio
Triglycerides: 96 mg/dL (ref <150)
VLDL: 19 mg/dL (ref 0–40)

## 2024-08-06 LAB — ETHANOL: Alcohol, Ethyl (B): <15 mg/dL (ref <15)

## 2024-08-06 LAB — TSH: TSH: 7.198 u[IU]/mL — ABNORMAL HIGH (ref 0.350–4.500)

## 2024-08-06 MED ORDER — HALOPERIDOL 5 MG PO TABS: 5.0000 mg | ORAL_TABLET | Freq: Three times a day (TID) | ORAL | Status: DC | PRN

## 2024-08-06 MED ORDER — ALUM & MAG HYDROXIDE-SIMETH 200-200-20 MG/5ML PO SUSP: 30.0000 mL | ORAL | Status: DC | PRN

## 2024-08-06 MED ORDER — LORAZEPAM 2 MG/ML IJ SOLN: 2.0000 mg | Freq: Three times a day (TID) | INTRAMUSCULAR | Status: DC | PRN

## 2024-08-06 MED ORDER — MAGNESIUM HYDROXIDE 400 MG/5ML PO SUSP: 30.0000 mL | Freq: Every day | ORAL | Status: DC | PRN

## 2024-08-06 MED ORDER — DIPHENHYDRAMINE HCL 50 MG/ML IJ SOLN: 50.0000 mg | Freq: Three times a day (TID) | INTRAMUSCULAR | Status: DC | PRN

## 2024-08-06 MED ORDER — ACETAMINOPHEN 325 MG PO TABS: 650.0000 mg | ORAL_TABLET | Freq: Four times a day (QID) | ORAL | Status: DC | PRN

## 2024-08-06 MED ORDER — HALOPERIDOL LACTATE 5 MG/ML IJ SOLN: 5.0000 mg | Freq: Three times a day (TID) | INTRAMUSCULAR | Status: DC | PRN

## 2024-08-06 MED ORDER — DIPHENHYDRAMINE HCL 50 MG PO CAPS: 50.0000 mg | ORAL_CAPSULE | Freq: Three times a day (TID) | ORAL | Status: DC | PRN

## 2024-08-06 MED ORDER — HALOPERIDOL LACTATE 5 MG/ML IJ SOLN: 10.0000 mg | Freq: Three times a day (TID) | INTRAMUSCULAR | Status: DC | PRN

## 2024-08-06 NOTE — ED Provider Notes (Signed)
 [facility] Continuous Assessment Admission H&P  Date: 08/06/24 Patient Name: Darren Melendez MRN: [id number] Chief Complaint: Just to be admitted due to thoughts of self-harm  Diagnoses:  Final diagnoses:  Adjustment disorder with depressed mood    HPI: Per triage, Darren Melendez 33y male presents to [facility] accompanied by his father, voluntarily. 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 PT states that he needs to be admitted today, pt admits to being at risk of being homeless. PT states his whole family wanted him to come today to get help. PT shares that he doesn't really have anyone and feels stressed, pt also mentions he is starting to hear things but not at the moment. PT states he sometimes hear you're not going anywhere type of depressive things. PT states a couple of months ago he tried to commit suicide by ingesting poison. PT denies HI, AVH at this time. PT explains that this past month he has had SI but no plan/intent. PT admits to consuming a gummy (infused with legal amount of THC) yesterday.   Chart reviewed and discussed with attending psychiatrist, Dr Kandi Hahn.  Pt is seen face-to-face on the [facility] Adult treatment area. Pt is alert & oriented x 4 and engages in today's visit. Pt with a history of hypermania, adjustment disorder, anxiety, and hypothyroidism.  Today, pt states he was brought to [facility] by his father just to be admitted due to thoughts of self-harm and it not being an option and that if I'm not admitted I'm going to be homeless. Pt states he has been living at the [facility] for the past 2 weeks. States the room was paid for by his parents because I have no job and no money. He states his parents had informed him that he needed to find a job and get into a shelter. Pt states he attempted to contact shelters but they never called me back. When asked about suicidal ideation, patient states so so; he denies  plan or intent at this time. He endorses one previous suicide attempt 2-3 months ago via ingestion of poison. He does not know what the poison was and states he did not seek medical treatment and was not hospitalized after this. He denies homicidal ideation, intent, or plan. He endorses AH for the past 1.5 months of degrading voices due to all the stress. He identifies stressful factors as no job, family factors, and I don't know what to do. He denies VH.  He denies current psychotropic medications and states his most recent medication was Abilify which he discontinued on his own 7-8 months ago because I didn't want to take it. He denies previous inpatient psychiatric hospitalizations. Pt was seen at [facility] in July, 2025 for similar presentation. He was offered inpatient hospitalization, however, he declined and opted for outpatient mental health resources. Pt states he did not have outpatient mental health follow up after being seen in July. Today, he endorses depressive episodes with persistent low mood, anhedonia, fatigue, poor sleep, impaired concentration, and significant feelings of hopelessness. Previous outpatient psychiatric provider, [facility] with last appt a couple of months ago. He denies future appts stating I don't know when next appt is scheduled. Pt is presenting today due to thoughts of self-harm and external pressure/stipulations from family to seek hospital admission; reports feeling unsafe if not admitted, with uncertainty about safety. Regarding motivation for treatment, patient rates motivation 8/10 with 10 being highly motivated. He identifies personal protective factors  as I'm not ready to give up. Pt will be admitted to OBS unit for continuous assessment to determine the clinically appropriate level of care.   Total Time spent with patient: 20 minutes  Musculoskeletal  Strength & Muscle Tone: within normal limits Gait & Station: normal Patient leans:  N/A  Psychiatric Specialty Exam  Presentation General Appearance:  Appropriate for Environment  Eye Contact: Fair  Speech: Clear and Coherent; Normal Rate  Speech Volume: Normal  Handedness: Right   Mood and Affect  Mood: Anxious; Depressed; Angry  Affect: Depressed   Thought Process  Thought Processes: Coherent  Descriptions of Associations:Circumstantial  Orientation:Full (Time, Place and Person)  Thought Content:WDL    Hallucinations:No data recorded Ideas of Reference:None  Suicidal Thoughts:No data recorded Homicidal Thoughts:No data recorded  Sensorium  Memory: Immediate Fair; Recent Fair; Remote Fair  Judgment: Fair  Insight: Fair   Art Therapist  Concentration: Fair  Attention Span: Fair  Recall: [employer] of Knowledge: Fair  Language: Fair   Psychomotor Activity  Psychomotor Activity:No data recorded  Assets  Assets: Communication Skills; Desire for Improvement; Financial Resources/Insurance; Housing; Physical Health; Leisure Time   Sleep  Sleep:No data recorded  No data recorded  Physical Exam Vitals and nursing note reviewed.  HENT:     Head: Normocephalic.     Mouth/Throat:     Mouth: Mucous membranes are moist.  Cardiovascular:     Rate and Rhythm: Normal rate.  Pulmonary:     Effort: Pulmonary effort is normal.  Musculoskeletal:        General: Normal range of motion.     Cervical back: Normal range of motion.  Skin:    General: Skin is warm and dry.  Neurological:     Mental Status: He is alert and oriented to person, place, and time.  Psychiatric:     Comments: See HPI    Review of Systems  Constitutional:  Negative for chills and fever.  HENT:  Negative for congestion and sore throat.   Eyes:        Wears corrective lenses  Respiratory:  Negative for cough and shortness of breath.   Cardiovascular:  Negative for chest pain and palpitations.  Gastrointestinal:  Negative for  diarrhea, nausea and vomiting.  Psychiatric/Behavioral:  Positive for depression and hallucinations. Negative for substance abuse.     Blood pressure (!) 129/100, pulse 99, temperature 98.2 F (36.8 C), resp. rate 18, SpO2 100%. There is no height or weight on file to calculate BMI.  Past Psychiatric History: hypermania per pt report, adjustment disorder, anxiety  Is the patient at risk to self? Yes  Has the patient been a risk to self in the past 6 months? Yes .    Has the patient been a risk to self within the distant past? No   Is the patient a risk to others? No   Has the patient been a risk to others in the past 6 months? No   Has the patient been a risk to others within the distant past? No   Past Medical History: Hypothyroidism  Family History: Mother: HTN; Maternal GM: breast cancer Family Psychiatric History: none reported  Social History: Single,. Housing unstable. 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   Last Labs:  No visits with results within 6 Month(s) from this visit.  Latest known visit with results is:  Hospital Outpatient Visit on 11/24/2009  Component Date Value Ref Range Status   Mono Screen 11/24/2009 NEGATIVE  NEGATIVE Final    Allergies: Codeine  Medications:  Facility Ordered Medications  Medication   acetaminophen (TYLENOL) tablet 650 mg   alum & mag hydroxide-simeth (MAALOX/MYLANTA) 200-200-20 MG/5ML suspension 30 mL   magnesium hydroxide (MILK OF MAGNESIA) suspension 30 mL   haloperidol (HALDOL) tablet 5 mg   And   diphenhydrAMINE (BENADRYL) capsule 50 mg   haloperidol lactate (HALDOL) injection 5 mg   And   diphenhydrAMINE (BENADRYL) injection 50 mg   And   LORazepam (ATIVAN) injection 2 mg   haloperidol lactate (HALDOL) injection 10 mg   And   diphenhydrAMINE (BENADRYL) injection 50 mg   And   LORazepam (ATIVAN) injection 2 mg      Medical Decision Making  Lab Orders         CBC with Differential/Platelet          Comprehensive metabolic panel         Hemoglobin A1c         Ethanol         Lipid panel         TSH         Urinalysis, Routine w reflex microscopic -Urine, Clean Catch         POCT Urine Drug Screen - (I-Screen)     EKG - QTc 405    Recommendations  Based on my evaluation the patient does not appear to have an emergency medical condition.  Medications Agitation protocol  Plan 1) Continuous assessment is needed to determine the clinically appropriate level of care. Ongoing supervision and support in de-escalating the crisis may help prevent the need for unnecessary hospitalization or a more intensive level of care.  2)Social work referral for shelter placement, housing resources, and community supports to include appt for outpatient mental health medication management  Sherrell Culver, PMHNP-BC, FNP-BC  08/06/24  3:27 PM

## 2024-08-06 NOTE — ED Notes (Signed)
 Patient resting quietly in bed with eyes closed, Respirations equal and unlabored, skin warm and dry, NAD. Routine safety checks conducted according to facility protocol. Will continue to monitor for safety.
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
## 2024-08-06 NOTE — ED Notes (Signed)
 Pt oriented to unit. The patient is oriented to person, place, and time. Pt provided with snack and drink juice. Skin assessment completed. Risk assessment was completed, high risk for self-harm no plan. Provider Sherrell, NP notified. Safety precautions have been implemented as appropriate. Envt secured.
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
## 2024-08-07 ENCOUNTER — Other Ambulatory Visit (HOSPITAL_COMMUNITY)
Admission: EM | Admit: 2024-08-07 | Discharge: 2024-08-11 | Disposition: A | Discharge status: Home / Self Care | Source: Intra-hospital

## 2024-08-07 DIAGNOSIS — F1510 Other stimulant abuse, uncomplicated: Secondary | ICD-10-CM | POA: Insufficient documentation

## 2024-08-07 DIAGNOSIS — Z79899 Other long term (current) drug therapy: Secondary | ICD-10-CM | POA: Insufficient documentation

## 2024-08-07 DIAGNOSIS — F1210 Cannabis abuse, uncomplicated: Secondary | ICD-10-CM | POA: Diagnosis present

## 2024-08-07 DIAGNOSIS — F1220 Cannabis dependence, uncomplicated: Secondary | ICD-10-CM | POA: Diagnosis not present

## 2024-08-07 DIAGNOSIS — E039 Hypothyroidism, unspecified: Secondary | ICD-10-CM | POA: Insufficient documentation

## 2024-08-07 DIAGNOSIS — F4323 Adjustment disorder with mixed anxiety and depressed mood: Secondary | ICD-10-CM | POA: Diagnosis not present

## 2024-08-07 DIAGNOSIS — F411 Generalized anxiety disorder: Secondary | ICD-10-CM | POA: Insufficient documentation

## 2024-08-07 DIAGNOSIS — B20 Human immunodeficiency virus [HIV] disease: Secondary | ICD-10-CM | POA: Diagnosis present

## 2024-08-07 DIAGNOSIS — R45851 Suicidal ideations: Secondary | ICD-10-CM | POA: Insufficient documentation

## 2024-08-07 DIAGNOSIS — F333 Major depressive disorder, recurrent, severe with psychotic symptoms: Secondary | ICD-10-CM | POA: Diagnosis not present

## 2024-08-07 MED ORDER — HALOPERIDOL 5 MG PO TABS: 5.0000 mg | ORAL_TABLET | Freq: Three times a day (TID) | ORAL | Status: DC | PRN

## 2024-08-07 MED ORDER — MAGNESIUM HYDROXIDE 400 MG/5ML PO SUSP: 30.0000 mL | Freq: Every day | ORAL | Status: DC | PRN

## 2024-08-07 MED ORDER — LEVOTHYROXINE SODIUM 25 MCG PO TABS: 50.0000 ug | ORAL_TABLET | Freq: Every day | ORAL | Status: DC

## 2024-08-07 MED ORDER — ARIPIPRAZOLE 5 MG PO TABS: 5.0000 mg | ORAL_TABLET | Freq: Every day | ORAL | Status: AC

## 2024-08-07 MED ORDER — HALOPERIDOL LACTATE 5 MG/ML IJ SOLN: 10.0000 mg | Freq: Three times a day (TID) | INTRAMUSCULAR | Status: DC | PRN

## 2024-08-07 MED ORDER — EMTRICITABINE-TENOFOVIR AF 200-25 MG PO TABS
1.0000 | ORAL_TABLET | Freq: Every day | ORAL | Status: DC
  Administered 2024-08-07: 1 via ORAL
  Filled 2024-08-07: qty 1

## 2024-08-07 MED ORDER — ARIPIPRAZOLE 5 MG PO TABS: 5.0000 mg | ORAL_TABLET | Freq: Every day | ORAL | Status: DC

## 2024-08-07 MED ORDER — LORAZEPAM 2 MG/ML IJ SOLN: 2.0000 mg | Freq: Three times a day (TID) | INTRAMUSCULAR | Status: DC | PRN

## 2024-08-07 MED ORDER — LEVOTHYROXINE SODIUM 25 MCG PO TABS
50.0000 ug | ORAL_TABLET | Freq: Every day | ORAL | Status: DC
  Administered 2024-08-08 – 2024-08-11 (×4): 50 ug via ORAL
  Filled 2024-08-07 (×4): qty 2

## 2024-08-07 MED ORDER — ACETAMINOPHEN 325 MG PO TABS: 650.0000 mg | ORAL_TABLET | Freq: Four times a day (QID) | ORAL | Status: DC | PRN

## 2024-08-07 MED ORDER — DIPHENHYDRAMINE HCL 50 MG/ML IJ SOLN: 50.0000 mg | Freq: Three times a day (TID) | INTRAMUSCULAR | Status: DC | PRN

## 2024-08-07 MED ORDER — HALOPERIDOL LACTATE 5 MG/ML IJ SOLN: 5.0000 mg | Freq: Three times a day (TID) | INTRAMUSCULAR | Status: DC | PRN

## 2024-08-07 MED ORDER — ARIPIPRAZOLE 5 MG PO TABS
5.0000 mg | ORAL_TABLET | Freq: Every day | ORAL | Status: DC
  Administered 2024-08-07 – 2024-08-10 (×4): 5 mg via ORAL
  Filled 2024-08-07 (×4): qty 1

## 2024-08-07 MED ORDER — ALUM & MAG HYDROXIDE-SIMETH 200-200-20 MG/5ML PO SUSP: 30.0000 mL | ORAL | Status: DC | PRN

## 2024-08-07 MED ORDER — DIPHENHYDRAMINE HCL 50 MG PO CAPS: 50.0000 mg | ORAL_CAPSULE | Freq: Three times a day (TID) | ORAL | Status: DC | PRN

## 2024-08-07 MED ORDER — EMTRICITABINE-TENOFOVIR AF 200-25 MG PO TABS
1.0000 | ORAL_TABLET | Freq: Every day | ORAL | Status: DC
  Administered 2024-08-08 – 2024-08-11 (×4): 1 via ORAL
  Filled 2024-08-07 (×4): qty 1

## 2024-08-07 MED ORDER — LEVOTHYROXINE SODIUM 25 MCG PO TABS
50.0000 ug | ORAL_TABLET | Freq: Every day | ORAL | Status: DC
  Administered 2024-08-07: 50 ug via ORAL
  Filled 2024-08-07: qty 2

## 2024-08-07 MED ORDER — TRAZODONE HCL 50 MG PO TABS
50.0000 mg | ORAL_TABLET | Freq: Every evening | ORAL | Status: DC | PRN
  Administered 2024-08-07: 50 mg via ORAL
  Filled 2024-08-07: qty 1

## 2024-08-07 MED ORDER — HYDROXYZINE HCL 25 MG PO TABS: 25.0000 mg | ORAL_TABLET | Freq: Three times a day (TID) | ORAL | Status: DC | PRN

## 2024-08-07 NOTE — ED Provider Notes (Signed)
 [facility]/OBS ASAP Discharge Summary  Date and Time: 08/07/2024 2:28 PM  Name: [facility]  MRN:  [id number]   Discharge Diagnoses:  Final diagnoses:  Cannabis abuse  Moderate episode of recurrent major depressive disorder (HCC)  Generalized anxiety disorder  Suicidal ideations    Darren [facility] is a 33 y.o. 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   Subjective:   On morning assessment, patient continues to report ongoing depression and is a 7 out of 10, with 10 being most severe.  Patient denies anxiety this morning. Patient reports symptoms regarding mood are related to multiple psychosocial stressors (homelessness, lack of direction in life, and financial instability)  Patient reports passive suicidal ideations last night prior to going to sleep, but denies intent or plan.  Patient does report a prior suicide attempt via ingestion of poison ~ 3 months prior, however does not give specifics about amount.  Patient denies homicidal ideations and visual hallucinations.  Patient does report that he hears voices other than his own, that are all he saying negative things nothing is ever gone to get better.  Patient denies that they are command auditory hallucinations telling him to hurt himself or hurt.  Patient reports he was previously on Abilify 5 mg daily and discontinued taking due to questionable therapeutic effect.  [facility], however has not seen within the last several months or been on medications.  Patient is not currently followed out by outpatient therapeutic resources also.     Patient also disclosed a history of marijuana use to help self-medicate for mood symptoms.  Patient reports varying effects with cannabis use and most recently used  approximately 3 to 4 days ago.  Patient has been attempting to decrease his substance use.  Discussed positive UDS of methamphetamines with patient, however denies any recent or prior history of use.  Patient is interested in available resources to help with sobriety regarding substance use.  Patient denies substance treatment at a facility or residential treatment previously.  Discussed the option of inpatient psychiatric treatment versus outpatient follow-up for medication management and shelter resources.  Patient amenable with placement in facility based crisis unit given ongoing depressive symptoms and desire for residential treatment to help maintain sobriety from substances.  Patient signed in voluntarily for treatment.  Stay Summary:   [facility] admission: HPI   Pt with a history of hypermania, adjustment disorder, anxiety, and hypothyroidism.  Today, pt states he was brought to [facility] by his father just to be admitted due to thoughts of self-harm and it not being an option and that if I'm not admitted I'm going to be homeless. Pt states he has been living at the [facility] for the past 2 weeks. States the room was paid for by his parents because I have no job and no money. He states his parents had informed him that he needed to find a job and get into a shelter. Pt states he attempted to contact shelters but they never called me back. When asked about suicidal ideation, patient states so so; he denies plan or intent at this time. He endorses one previous suicide attempt 2-3 months ago via ingestion of poison. He does not know what the poison was and states he did not seek medical treatment and was not hospitalized after this. He denies  homicidal ideation, intent, or plan. He endorses AH for the past 1.5 months of degrading voices due to all the stress. He identifies stressful factors as no job, family factors, and I don't know what to do. He denies VH.  He denies current psychotropic  medications and states his most recent medication was Abilify which he discontinued on his own 7-8 months ago because I didn't want to take it. He denies previous inpatient psychiatric hospitalizations. Pt was seen at [facility] in July, 2025 for similar presentation. He was offered inpatient hospitalization, however, he declined and opted for outpatient mental health resources. Pt states he did not have outpatient mental health follow up after being seen in July. Today, he endorses depressive episodes with persistent low mood, anhedonia, fatigue, poor sleep, impaired concentration, and significant feelings of hopelessness. Previous outpatient psychiatric provider, [facility] with last appt a couple of months ago. He denies future appts stating I don't know when next appt is scheduled. Pt is presenting today due to thoughts of self-harm and external pressure/stipulations from family to seek hospital admission; reports feeling unsafe if not admitted, with uncertainty about safety. Regarding motivation for treatment, patient rates motivation 8/10 with 10 being highly motivated. He identifies personal protective factors as I'm not ready to give up. Pt will be admitted to OBS unit for continuous assessment to determine the clinically appropriate level of care.   Day of discharge  Patient was admitted to the observation unit for symptom stabilization and reassessment for dispo planning.  Patient was reassessed and was amenable with voluntary treatment for inpatient psychiatric hospitalization.  Patient was motivated to get back on medications and pursue residential treatment program to help address substance use.  He was restarted on Abilify 5 mg at bedtime to help address mood stability and auditory hallucinations,/negative thoughts.  He was transferred to the facility base crisis unit on 08/07/2024.   Total Time spent with patient: 45 minutes  Past Psychiatric History: hypermania per pt  report, adjustment disorder, anxiety  Past Medical History: HIV, hypothyroidism, Family History: None disclosed  Family Psychiatric History: None disclosed  Social History: Patient currently homeless and unemployed.  Was previously living with family members in setting of housing instability.  Originally from [place]  and Father nearby for support.  Tobacco Cessation:  Prescription not provided because: transferred to [facility]   Current Medications:  Current Facility-Administered Medications  Medication Dose Route Frequency Provider Last Rate Last Admin   acetaminophen (TYLENOL) tablet 650 mg  650 mg Oral Q6H PRN Hobson, Fran E, NP       alum & mag hydroxide-simeth (MAALOX/MYLANTA) 200-200-20 MG/5ML suspension 30 mL  30 mL Oral Q4H PRN Hobson, Fran E, NP       ARIPiprazole (ABILIFY) tablet 5 mg  5 mg Oral QHS Lenard Calin, MD       haloperidol (HALDOL) tablet 5 mg  5 mg Oral TID PRN Hobson, Fran E, NP       And   diphenhydrAMINE (BENADRYL) capsule 50 mg  50 mg Oral TID PRN Hobson, Fran E, NP       haloperidol lactate (HALDOL) injection 5 mg  5 mg Intramuscular TID PRN Hobson, Fran E, NP       And   diphenhydrAMINE (BENADRYL) injection 50 mg  50 mg Intramuscular TID PRN Hobson, Fran E, NP       And   LORazepam (ATIVAN) injection 2 mg  2 mg Intramuscular TID PRN Hobson, Fran E, NP  haloperidol lactate (HALDOL) injection 10 mg  10 mg Intramuscular TID PRN Hobson, Fran E, NP       And   diphenhydrAMINE (BENADRYL) injection 50 mg  50 mg Intramuscular TID PRN Hobson, Fran E, NP       And   LORazepam (ATIVAN) injection 2 mg  2 mg Intramuscular TID PRN Hobson, Fran E, NP       emtricitabine-tenofovir AF (DESCOVY) 200-25 MG per tablet 1 tablet  1 tablet Oral Daily Lenard Calin, MD   1 tablet at 08/07/24 1257   levothyroxine (SYNTHROID) tablet 50 mcg  50 mcg Oral Daily Nadina Fomby, MD   50 mcg at 08/07/24 1257   magnesium hydroxide (MILK OF MAGNESIA) suspension 30 mL  30 mL Oral Daily PRN  Hobson, Fran E, NP       Current Outpatient Medications  Medication Sig Dispense Refill   emtricitabine-tenofovir AF (DESCOVY) 200-25 MG tablet Take 1 tablet by mouth daily.     ARIPiprazole (ABILIFY) 5 MG tablet Take 1 tablet (5 mg total) by mouth at bedtime.     hydrOXYzine (ATARAX) 25 MG tablet Take 25 mg by mouth at bedtime. (Patient not taking: Reported on 08/07/2024)     levothyroxine (SYNTHROID) 50 MCG tablet Take 50 mcg by mouth daily. (Patient not taking: Reported on 08/07/2024)     MAGNESIUM GLYCINATE PO Take 1 capsule by mouth daily. (Patient not taking: Reported on 08/07/2024)      PTA Medications:  Facility Ordered Medications  Medication   acetaminophen (TYLENOL) tablet 650 mg   alum & mag hydroxide-simeth (MAALOX/MYLANTA) 200-200-20 MG/5ML suspension 30 mL   magnesium hydroxide (MILK OF MAGNESIA) suspension 30 mL   haloperidol (HALDOL) tablet 5 mg   And   diphenhydrAMINE (BENADRYL) capsule 50 mg   haloperidol lactate (HALDOL) injection 5 mg   And   diphenhydrAMINE (BENADRYL) injection 50 mg   And   LORazepam (ATIVAN) injection 2 mg   haloperidol lactate (HALDOL) injection 10 mg   And   diphenhydrAMINE (BENADRYL) injection 50 mg   And   LORazepam (ATIVAN) injection 2 mg   ARIPiprazole (ABILIFY) tablet 5 mg   levothyroxine (SYNTHROID) tablet 50 mcg   emtricitabine-tenofovir AF (DESCOVY) 200-25 MG per tablet 1 tablet   PTA Medications  Medication Sig   emtricitabine-tenofovir AF (DESCOVY) 200-25 MG tablet Take 1 tablet by mouth daily.   MAGNESIUM GLYCINATE PO Take 1 capsule by mouth daily. (Patient not taking: Reported on 08/07/2024)   hydrOXYzine (ATARAX) 25 MG tablet Take 25 mg by mouth at bedtime. (Patient not taking: Reported on 08/07/2024)   levothyroxine (SYNTHROID) 50 MCG tablet Take 50 mcg by mouth daily. (Patient not taking: Reported on 08/07/2024)   ARIPiprazole (ABILIFY) 5 MG tablet Take 1 tablet (5 mg total) by mouth at bedtime.        No data to  display          Flowsheet Row ED from 08/06/2024 in [facility] ED from 07[facility]06/2024 in [facility] Department at [facility] from 05/29/2024 in [facility] Emergency Department at [facility]  C-SSRS RISK CATEGORY High Risk No Risk No Risk    Musculoskeletal  Strength & Muscle Tone: within normal limits Gait & Station: normal Patient leans: N/A  Psychiatric Specialty Exam  Presentation  General Appearance:  Appropriate for Environment; Casual  Eye Contact: Fair  Speech: Clear and Coherent  Speech Volume: Normal  Handedness: Right   Mood and Affect  Mood: Hopeless; Depressed  Affect: Appropriate   Thought Process  Thought Processes: Coherent  Descriptions of Associations:Intact  Orientation:Full (Time, Place and Person)  Thought Content:Logical  Diagnosis of Schizophrenia or Schizoaffective disorder in past: No    Hallucinations:Hallucinations: Auditory Description of Auditory Hallucinations:  things aren't going to get better  Ideas of Reference:None  Suicidal Thoughts:Suicidal Thoughts: Yes, Passive SI Passive Intent and/or Plan: Without Intent  Homicidal Thoughts:Homicidal Thoughts: No   Sensorium  Memory: Immediate Fair  Judgment: Intact  Insight: Fair   Chartered Certified Accountant: Fair  Attention Span: Fair  Recall: Fair  Fund of Knowledge: Good  Language: Good   Psychomotor Activity  Psychomotor Activity: Psychomotor Activity: Normal   Assets  Assets: Communication Skills; Desire for Improvement; Resilience   Sleep  Sleep: Sleep: Fair  Estimated Sleeping Duration (Last 24 Hours): 13.50-16.00 hours  Nutritional Assessment (For OBS and [facility] admissions only) Has the patient had a weight loss or gain of 10 pounds or more in the last 3 months?: No Has the patient had a decrease in food intake/or appetite?: No Does the patient have dental  problems?: No Does the patient have eating habits or behaviors that may be indicators of an eating disorder including binging or inducing vomiting?: No Has the patient recently lost weight without trying?: 0 Has the patient been eating poorly because of a decreased appetite?: 0 Malnutrition Screening Tool Score: 0    Physical Exam  Physical Exam Constitutional:      Appearance: Normal appearance. He is not ill-appearing, toxic-appearing or diaphoretic.  Pulmonary:     Effort: Pulmonary effort is normal.  Musculoskeletal:        General: Normal range of motion.  Neurological:     Mental Status: He is alert.    Review of Systems  Constitutional:  Negative for chills and fever.  Respiratory:  Negative for cough.   Gastrointestinal:  Negative for nausea and vomiting.  Psychiatric/Behavioral:  Positive for depression, substance abuse and suicidal ideas. The patient is not nervous/anxious and does not have insomnia.    Blood pressure (!) 127/101, pulse 76, temperature 97.9 F (36.6 C), temperature source Oral, resp. rate 16, SpO2 97%. There is no height or weight on file to calculate BMI.  Demographic Factors:  Male, Caucasian, Gay, lesbian, or bisexual orientation, Low socioeconomic status, and Unemployed  Loss Factors: Decrease in vocational status, Decline in physical health, and Financial problems/change in socioeconomic status  Historical Factors: Prior suicide attempts and Impulsivity  Risk Reduction Factors:   NA  Continued Clinical Symptoms:  Depression:   Hopelessness Alcohol/Substance Abuse/Dependencies More than one psychiatric diagnosis Unstable or Poor Therapeutic Relationship Previous Psychiatric Diagnoses and Treatments  Cognitive Features That Contribute To Risk:  None    Suicide Risk:  Moderate:  Frequent suicidal ideation with limited intensity, and duration, some specificity in terms of plans, no associated intent, good self-control, limited  dysphoria/symptomatology, some risk factors present, and identifiable protective factors, including available and accessible social support.  Plan Of Care/Follow-up recommendations:  Patient recommended for inpatient psychiatric hospitalization, patient amenable for voluntary treatment with goals of pursuing residential program.  Disposition: [facility]  PATTI OLDEN, MD 08/07/2024, 2:28 PM

## 2024-08-07 NOTE — ED Notes (Signed)
 Pt oriented to unit. The patient is oriented to person, place, and time. Pt provided with snack and drink juice.  Alert and oriented x3. No current suicidal or homicidal ideation reported. No hallucinations or delusions observed. Patient is tolerating the milieu. Vitals within normal limits. Skin assessment completed. Safety precautions have been implemented as appropriate. Envt secured.

## 2024-08-07 NOTE — Progress Notes (Signed)
 Report called to Com[facility], RN [facility]. Pt transferred to [facility].

## 2024-08-07 NOTE — Discharge Instructions (Signed)
[facility]

## 2024-08-07 NOTE — ED Notes (Signed)
 Patient was provided lunch

## 2024-08-07 NOTE — ED Notes (Signed)
 Patient monitored throughout shift with no significant issues/stable presentation. Patient remained calm/cooperative on the unit. Oriented to person, place, and time.  Denies suicidal or homicidal ideation. No hallucinations or delusions reported or observed. Eating and fluid intake were adequate. No medication issues reported; patient compliant with prescribed regimen.

## 2024-08-07 NOTE — ED Notes (Signed)
 Patient is in the bedroom calm and sleeping.  NAD. Respirations even and unlabored. Will monitor for safety.

## 2024-08-07 NOTE — Progress Notes (Signed)
Pt is awake, alert and oriented X4. Pt did not voice any complaints of pain or discomfort. No signs of acute distress noted. Pt denies current SI/HI/AVH, plan or intent. Staff will monitor for pt's safety. 

## 2024-08-07 NOTE — ED Provider Notes (Signed)
 Facility Based Crisis Admission H&P  Date: 08/07/24 Patient Name: Darren Melendez MRN: [id number] Chief Complaint:  I feel hopeless   Diagnoses:  Final diagnoses:  Cannabis abuse  Moderate episode of recurrent major depressive disorder (HCC)  Generalized anxiety disorder  Suicidal ideations    HPI:  Darren Melendez is a 33 y.o. 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    On morning assessment, patient continues to report ongoing depression and is a 7 out of 10, with 10 being most severe.  Patient denies anxiety this morning. Patient reports symptoms regarding mood are related to multiple psychosocial stressors (homelessness, lack of direction in life, and financial instability)  Patient reports passive suicidal ideations last night prior to going to sleep, but denies intent or plan.  Patient does report a prior suicide attempt via ingestion of poison ~ 3 months prior, however does not give specifics about amount.  Patient denies homicidal ideations and visual hallucinations.  Patient does report that he hears voices other than his own, that are all he saying negative things nothing is ever gone to get better.  Patient denies that they are command auditory hallucinations telling him to hurt himself or hurt.  Patient reports he was previously on Abilify 5 mg daily and discontinued taking due to questionable therapeutic effect.  Patient was previously followed by neuropsychiatric care for med management, however has not seen within the last several months or been on medications.  Patient is not currently followed out by outpatient therapeutic resources also.       Patient also disclosed a history of marijuana use to help self-medicate for mood symptoms.  Patient reports varying effects with cannabis use and most recently  used approximately 3 to 4 days ago.  Patient has been attempting to decrease his substance use.  Patient disclosed substance history started in college and he recently discontinued.  Substances are primarily cannabis and he denies alcoholic ingestion or any additional substances. No history of DT's, seizures or auditory visual hallucinations in setting of alcohol withdrawal.  Discussed positive UDS of methamphetamines with patient, however denies any recent or prior history of use.  Patient is interested in available resources to help with sobriety regarding substance use.  Patient denies substance treatment at a facility or residential treatment previously.  Discussed the option of inpatient psychiatric treatment versus outpatient follow-up for medication management and shelter resources.  Patient amenable with placement in facility based crisis unit given ongoing depressive symptoms and desire for residential treatment to help maintain sobriety from substances.  Patient signed in voluntarily for treatment.  PHQ 2-9:   Flowsheet Row ED from 08/07/2024 in [facility] ED from 08/06/2024 in [facility] ED from 07/06/2024 in [facility] Emergency Department at [facility]  C-SSRS RISK CATEGORY No Risk High Risk No Risk      Total Time spent with patient: 45 minutes  Musculoskeletal  Strength & Muscle Tone: within normal limits Gait & Station: normal Patient leans: N/A  Psychiatric Specialty Exam  Presentation General Appearance:  Appropriate for Environment; Casual  Eye Contact: Fair  Speech: Clear and Coherent  Speech Volume: Normal  Handedness: Right   Mood and Affect  Mood: Hopeless; Depressed  Affect: Appropriate   Thought Process  Thought Processes: Coherent  Descriptions of Associations:Intact  Orientation:Full (Time, Place and Person)  Thought Content:Logical  Diagnosis of Schizophrenia or  Schizoaffective disorder in past: No   Hallucinations:Hallucinations: Auditory Description of Auditory Hallucinations:  things aren't going to get better  Ideas of Reference:None  Suicidal Thoughts:Suicidal Thoughts: Yes, Passive SI Passive Intent and/or Plan: Without Intent  Homicidal Thoughts:Homicidal Thoughts: No   Sensorium  Memory: Immediate Fair  Judgment: Intact  Insight: Fair   Chartered Certified Accountant: Fair  Attention Span: Fair  Recall: Fair  Fund of Knowledge: Good  Language: Good   Psychomotor Activity  Psychomotor Activity: Psychomotor Activity: Normal   Assets  Assets: Communication Skills; Desire for Improvement; Resilience   Sleep  Sleep: Sleep: Fair Number of Hours of Sleep: 6   Nutritional Assessment (For OBS and FBC admissions only) Has the patient had a weight loss or gain of 10 pounds or more in the last 3 months?: No Has the patient had a decrease in food intake/or appetite?: No Does the patient have dental problems?: No Does the patient have eating habits or behaviors that may be indicators of an eating disorder including binging or inducing vomiting?: No Has the patient recently lost weight without trying?: 0 Has the patient been eating poorly because of a decreased appetite?: 0 Malnutrition Screening Tool Score: 0  Physical Exam Constitutional:      Appearance: Normal appearance. He is not ill-appearing, toxic-appearing or diaphoretic.  Pulmonary:     Effort: Pulmonary effort is normal.  Musculoskeletal:        General: Normal range of motion.  Neurological:     Mental Status: He is alert.     Review of Systems  Constitutional:  Negative for chills and fever.  Respiratory:  Negative for cough.   Gastrointestinal:  Negative for nausea and vomiting.  Psychiatric/Behavioral:  Positive for depression, substance abuse and suicidal ideas. The patient is not nervous/anxious and does not have insomnia.     Blood pressure (!) 127/101, pulse 76, temperature 97.9 F (36.6 C), temperature source Oral, resp. rate 16, SpO2 97%. There is no height or weight on file to calculate BMI.   Past Psychiatric History: hypermania per pt report, adjustment disorder, anxiety   Is the patient at risk to self? Yes  Has the patient been a risk to self in the past 6 months? Yes .    Has the patient been a risk to self within the distant past? No   Is the patient a risk to others? No   Has the patient been a risk to others in the past 6 months? No   Has the patient been a risk to others within the distant past? No   Past Medical History: HIV, hypothyroidism, Family History: None disclosed  Family Psychiatric History: None disclosed  Social History: Patient currently homeless and unemployed.  Was previously living with family members in setting of housing instability.  Originally from [place]  and Father nearby for support.   Last Labs:  Admission on 08/06/2024, Discharged on 08/07/2024  Component Date Value Ref Range Status   WBC 08/06/2024 9.5  4.0 - 10.5 K/uL Final   RBC 08/06/2024 5.47  4.22 - 5.81 MIL/uL Final   Hemoglobin 08/06/2024 16.8  13.0 - 17.0 g/dL Final   HCT 89/73/7974 51.1  39.0 - 52.0 % Final   MCV 08/06/2024 93.4  80.0 - 100.0 fL Final   MCH 08/06/2024 30.7  26.0 - 34.0 pg Final   MCHC 08/06/2024 32.9  30.0 - 36.0 g/dL Final   RDW 89/73/7974 12.4  11.5 -  15.5 % Final   Platelets 08/06/2024 273  150 - 400 K/uL Final   nRBC 08/06/2024 0.0  0.0 - 0.2 % Final   Neutrophils Relative % 08/06/2024 67  % Final   Neutro Abs 08/06/2024 6.4  1.7 - 7.7 K/uL Final   Lymphocytes Relative 08/06/2024 24  % Final   Lymphs Abs 08/06/2024 2.3  0.7 - 4.0 K/uL Final   Monocytes Relative 08/06/2024 7  % Final   Monocytes Absolute 08/06/2024 0.6  0.1 - 1.0 K/uL Final   Eosinophils Relative 08/06/2024 1  % Final   Eosinophils Absolute 08/06/2024 0.1  0.0 - 0.5 K/uL Final   Basophils Relative 08/06/2024  1  % Final   Basophils Absolute 08/06/2024 0.1  0.0 - 0.1 K/uL Final   Immature Granulocytes 08/06/2024 0  % Final   Abs Immature Granulocytes 08/06/2024 0.04  0.00 - 0.07 K/uL Final   Performed at [facility], [address] [place], [place] [address]   Sodium 08/06/2024 137  135 - 145 mmol/L Final   Potassium 08/06/2024 4.3  3.5 - 5.1 mmol/L Final   Chloride 08/06/2024 100  98 - 111 mmol/L Final   CO2 08/06/2024 29  22 - 32 mmol/L Final   Glucose, Bld 08/06/2024 80  70 - 99 mg/dL Final   Glucose reference range applies only to samples taken after fasting for at least 8 hours.   BUN 08/06/2024 11  6 - 20 mg/dL Final   Creatinine, Ser 08/06/2024 0.89  0.61 - 1.24 mg/dL Final   Calcium 89/73/7974 9.8  8.9 - 10.3 mg/dL Final   Total Protein 89/73/7974 7.8  6.5 - 8.1 g/dL Final   Albumin 89/73/7974 4.1  3.5 - 5.0 g/dL Final   AST 89/73/7974 24  15 - 41 U/L Final   ALT 08/06/2024 37  0 - 44 U/L Final   Alkaline Phosphatase 08/06/2024 99  38 - 126 U/L Final   Total Bilirubin 08/06/2024 0.4  0.0 - 1.2 mg/dL Final   GFR, Estimated 08/06/2024 >60  >60 mL/min Final   Comment: (NOTE) Calculated using the CKD-EPI Creatinine Equation (2021)    Anion gap 08/06/2024 8  5 - 15 Final   Performed at [facility], [address], [place], [place] [address]   Hgb A1c MFr Bld 08/06/2024 5.0  4.8 - 5.6 % Final   Comment: (NOTE) Diagnosis of Diabetes The following HbA1c ranges recommended by the American Diabetes Association (ADA) may be used as an aid in the diagnosis of diabetes mellitus.  Hemoglobin             Suggested A1C NGSP%              Diagnosis  <5.7                   Non Diabetic  5.7-6.4                Pre-Diabetic  >6.4                   Diabetic  <7.0                   Glycemic control for                       adults with diabetes.     Mean Plasma Glucose 08/06/2024 96.8  mg/dL Final   Performed at [facility], [address], [place], [place] [address]    Alcohol, Ethyl (B) 08/06/2024 <15  <15 mg/dL Final   Comment: (NOTE) For medical purposes only. Performed at [facility], [address] [place], [place] [address]    Cholesterol 08/06/2024 154  0 - 200 mg/dL Final   Triglycerides 89/73/7974 96  <150 mg/dL Final   HDL 89/73/7974 54  >40 mg/dL Final   Total CHOL/HDL Ratio 08/06/2024 2.9  RATIO Final   VLDL 08/06/2024 19  0 - 40 mg/dL Final   LDL Cholesterol 08/06/2024 81  0 - 99 mg/dL Final   Comment:        Total Cholesterol/HDL:CHD Risk Coronary Heart Disease Risk Table                     Men   Women  1/2 Average Risk   3.4   3.3  Average Risk       5.0   4.4  2 X Average Risk   9.6   7.1  3 X Average Risk  23.4   11.0        Use the calculated Patient Ratio above and the CHD Risk Table to determine the patient's CHD Risk.        ATP III CLASSIFICATION (LDL):  <100     mg/dL   Optimal  899-870  mg/dL   Near or Above                    Optimal  130-159  mg/dL   Borderline  839-810  mg/dL   High  >809     mg/dL   Very High Performed at [facility], [address], [place], [place] [address]    TSH 08/06/2024 7.198 (H)  0.350 - 4.500 uIU/mL Final   Comment: Performed by a 3rd Generation assay with a functional sensitivity of <=0.01 uIU/mL. Performed at [facility], [address] [place], [place] [address]    Color, Urine 08/06/2024 YELLOW  YELLOW Final   APPearance 08/06/2024 CLEAR  CLEAR Final   Specific Gravity, Urine 08/06/2024 1.020  1.005 - 1.030 Final   pH 08/06/2024 6.0  5.0 - 8.0 Final   Glucose, UA 08/06/2024 NEGATIVE  NEGATIVE mg/dL Final   Hgb urine dipstick 08/06/2024 NEGATIVE  NEGATIVE Final   Bilirubin Urine 08/06/2024 NEGATIVE  NEGATIVE Final   Ketones, ur 08/06/2024 NEGATIVE  NEGATIVE mg/dL Final   Protein, ur 89/73/7974 NEGATIVE  NEGATIVE mg/dL Final   Nitrite 89/73/7974 NEGATIVE  NEGATIVE Final   Leukocytes,Ua 08/06/2024 NEGATIVE  NEGATIVE Final   Performed at [facility], [address] [place], [place] [address]   POC Amphetamine UR 08/06/2024 Positive (A)  NONE DETECTED (Cut Off Level 1000 ng/mL) Final   POC Secobarbital (BAR) 08/06/2024 None Detected  NONE DETECTED (Cut Off Level 300 ng/mL) Final   POC Buprenorphine (BUP) 08/06/2024 None Detected  NONE DETECTED (Cut Off Level 10 ng/mL) Final   POC Oxazepam (BZO) 08/06/2024 None Detected  NONE DETECTED (Cut Off Level 300 ng/mL) Final   POC Cocaine UR 08/06/2024 None Detected  NONE DETECTED (Cut Off Level 300 ng/mL) Final   POC Methamphetamine UR 08/06/2024 Positive (A)  NONE DETECTED (Cut Off Level 1000 ng/mL) Final   POC Morphine 08/06/2024 None Detected  NONE DETECTED (Cut Off Level 300 ng/mL) Final   POC Methadone UR 08/06/2024 None Detected  NONE DETECTED (Cut Off Level 300 ng/mL) Final   POC Oxycodone  UR 08/06/2024 None Detected  NONE DETECTED (Cut Off Level 100 ng/mL) Final   POC Marijuana UR 08/06/2024 None Detected  NONE DETECTED (Cut Off Level 50 ng/mL) Final    Allergies: Acetaminophen-codeine, Codeine, Hydrocodone, and Hydrocodone-acetaminophen  Medications:  PTA Medications  Medication Sig   emtricitabine-tenofovir AF (DESCOVY) 200-25 MG tablet Take 1 tablet by mouth daily.   MAGNESIUM GLYCINATE PO Take 1 capsule by mouth daily. (Patient not taking: Reported on 08/07/2024)   hydrOXYzine (ATARAX) 25 MG tablet Take 25 mg by mouth at bedtime. (Patient not taking: Reported on 08/07/2024)   levothyroxine (SYNTHROID) 50 MCG tablet Take 50 mcg by mouth daily. (Patient not taking: Reported on 08/07/2024)   ARIPiprazole (ABILIFY) 5 MG tablet Take 1 tablet (5 mg total) by mouth at bedtime.    Long Term Goals: Improvement in symptoms so as ready for discharge  Short Term Goals: Patient will verbalize feelings in meetings with treatment team members., Patient will score a low risk of violence for 24 hours prior to discharge, and Patient will take medications as prescribed daily.  Medical  Decision Making  Darren Melendez is a 33 y.o. male with a past reported history of adjustment disorder, self reported hypermania, and anxiety who presented voluntarily to [facility] accompanied bu his father for worsening depression and suicidal ideations.    On assessment, patient presenting with symptoms concerning for worsening depression and intermittent suicidal ideations.  Patient reports a recent overdose attempt approximately 3 months ago with undisclosed amount of poisoning.  Given patient currently not on any psychotropic medications to help address mood symptoms, no current outpatient psychiatric follow-up or therapeutic services recommend inpatient psychiatric treatment for symptomatic stabilization and medication management.  Will restart Abilify 5 mg for mood stabilization and patient's description of auditory hallucinations versus negative ruminations. Also given patient's history of marijuana use and positive UDS of methamphetamines, patient is interested in available resources to help with sobriety regarding substance use.  Patient denies substance treatment at a facility or residential treatment previously. Patient amenable with placement in facility based crisis unit given ongoing depressive symptoms and desire for residential treatment to help maintain sobriety from substances.   Recommendations  Based on my evaluation the patient does not appear to have an emergency medical condition.  - Restart Abilify 5 mg at bedtime for psychosis/mood stabilization - Resume home medications for chronic medical conditions Levothyroxine 50 mcg daily hypothyroid  Discovey daily for HIV  -- PRN agitation protocol and additional supportive meds, refer to [facility] for details  -- SW to help with residential treatment referral   PATTI OLDEN, MD 08/07/24  4:24 PM

## 2024-08-07 NOTE — Care Management (Signed)
 [facility] Care Management...  Writer met with the patient and discussed discharge planning.    Darren Melendez 33 yo male. Patient stated recently becoming homeless, just put out by family  Reported using THC(gummies), Meth. Patient reported having Medicaid.   Patient requests inpatient substance abuse treatment.     Patient has been referred to [facility], [facility] and [facility].

## 2024-08-07 NOTE — Group Note (Signed)
 Group Topic: Social Support  Group Date: 08/07/2024 Start Time: 2000 End Time: 2030 Facilitators: Joan Plowman B  Department: [facility]  Number of Participants: 3  Group Focus: check in Treatment Modality:  Leisure Development Interventions utilized were support Purpose: express feelings, increase insight, and relapse prevention strategies  Name: JARRYN ALTLAND Date of Birth: January 25, 1991  MR: [id number]    Level of Participation:  PT DID NOT ATTEND GROUPS   Patients Problems:  Patient Active Problem List   Diagnosis Date Noted   HIV disease (HCC) 08/07/2024   Hypothyroidism 08/07/2024   Cannabis abuse 08/07/2024

## 2024-08-07 NOTE — Progress Notes (Signed)
 Pt is asleep. Respirations are even and unlabored. No signs of acute distress noted. Staff will monitor for pt's safety.

## 2024-08-08 DIAGNOSIS — F4323 Adjustment disorder with mixed anxiety and depressed mood: Secondary | ICD-10-CM | POA: Diagnosis not present

## 2024-08-08 DIAGNOSIS — F1220 Cannabis dependence, uncomplicated: Secondary | ICD-10-CM | POA: Diagnosis not present

## 2024-08-08 DIAGNOSIS — F1510 Other stimulant abuse, uncomplicated: Secondary | ICD-10-CM | POA: Diagnosis not present

## 2024-08-08 DIAGNOSIS — F333 Major depressive disorder, recurrent, severe with psychotic symptoms: Secondary | ICD-10-CM | POA: Diagnosis not present

## 2024-08-08 LAB — T4, FREE: Free T4: 0.69 ng/dL (ref 0.61–1.12)

## 2024-08-08 MED ORDER — FLUOXETINE HCL 10 MG PO CAPS
10.0000 mg | ORAL_CAPSULE | Freq: Every day | ORAL | Status: DC
  Administered 2024-08-08 – 2024-08-09 (×2): 10 mg via ORAL
  Filled 2024-08-08 (×2): qty 1

## 2024-08-08 MED ORDER — MELATONIN 5 MG PO TABS
5.0000 mg | ORAL_TABLET | Freq: Every day | ORAL | Status: DC
  Administered 2024-08-08 – 2024-08-10 (×3): 5 mg via ORAL
  Filled 2024-08-08 (×3): qty 1

## 2024-08-08 NOTE — ED Notes (Signed)
 Paitent provided breakfast.

## 2024-08-08 NOTE — ED Notes (Signed)
 Pt asleep at this time,NAD.  Will continue to monitor for safety.

## 2024-08-08 NOTE — ED Notes (Signed)
 Pt generally isolative, currently in room. No concerns or complaints reported to RN, no distress observed. Pt ate dinner.

## 2024-08-08 NOTE — ED Notes (Signed)
 New medication prozac discussed with pt, questions denied, pt declined educational handout

## 2024-08-08 NOTE — ED Notes (Signed)
 Pt was observed and assessed in the  bedroom where pt was resting. Blood was drawn on pt successfully and pt tolerated it well. Pt denies SI/HI/AVH. Pt reports no further complaint currently. Q15 safety checks in place.

## 2024-08-08 NOTE — Group Note (Signed)
 Group Topic: Wellness  Group Date: 08/08/2024 Start Time: 1200 End Time: 1230 Facilitators: Daved Tinnie HERO, RN  Department: [facility]  Number of Participants: 3  Group Focus: check in Treatment Modality:  Psychoeducation Interventions utilized were patient education Purpose: increase insight  Name: Darren Melendez Date of Birth: 11-01-1990  MR: [id number]    Level of Participation: moderate Quality of Participation: attentive and cooperative Interactions with others: gave feedback Mood/Affect: appropriate Triggers (if applicable): n/a Cognition: coherent/clear Progress: Gaining insight Response: pt reports to feel 'so, so', medications reviewed including prozac, questions denied Plan: patient will be encouraged to attend futurs RN education and check-in groups   Patients Problems:  Patient Active Problem List   Diagnosis Date Noted   HIV disease (HCC) 08/07/2024   Hypothyroidism 08/07/2024   Cannabis abuse 08/07/2024

## 2024-08-08 NOTE — Care Management (Signed)
 [facility] Care Management...  Writer met with the patient and discussed discharge planning.  Patient requests inpatient substance abuse treatment.     Patient has been referred to [facility], [facility] and [facility]  10:00 am Patient was declined at [facility] due to having [facility]  11:30 am Patient was declined at [facility], they do not accept his particular Medicaid Plan  1:45 pm Per Joen @ [facility]. Patient would need to switch from Medicaid Healthy Blue to a Medicaid Tailored Plan in order to be accepted for their program.   2:00 pm Writer obtained Medicaid site to request a switch of plan.  Writer completed forms and submitted application to switch to Tailored Plan   Writer will continue to refer patient for inpatient treatment   Writer will provided patient with [employer], [facility] resources

## 2024-08-08 NOTE — Group Note (Signed)
 Group Topic: Social Support  Group Date: 08/08/2024 Start Time: 2000 End Time: 2030 Facilitators: Joan Plowman B  Department: [facility]  Number of Participants: 5  Group Focus: acceptance and check in Treatment Modality:  Individual Therapy Interventions utilized were leisure development, problem solving, and support Purpose: express feelings  Name: Darren Melendez Date of Birth: Mar 09, 1991  MR: [id number]    Level of Participation: active Quality of Participation: attentive and cooperative Interactions with others: gave feedback Mood/Affect: appropriate Triggers (if applicable): NA Cognition: coherent/clear Progress: Gaining insight Response: NA Plan: patient will be encouraged to keep going to groups  Patients Problems:  Patient Active Problem List   Diagnosis Date Noted   HIV disease (HCC) 08/07/2024   Hypothyroidism 08/07/2024   Cannabis abuse 08/07/2024

## 2024-08-08 NOTE — ED Provider Notes (Cosign Needed Addendum)
 Behavioral Health Progress Note   Date: 08/08/24 Patient Name: Darren Melendez MRN: [id number] Diagnoses:  Final diagnoses:  MDD (major depressive disorder), recurrent, severe, with psychosis (HCC)  Methamphetamine abuse (HCC)  Delta-9-tetrahydrocannabinol (THC) dependence (HCC)   HPI: Per admissions assessment: Darren Melendez is a 33 y.o. male with a past reported history of adjustment disorder, self reported hypermania, and anxiety who presented voluntarily to [facility] accompanied bu his father for worsening depression and suicidal ideations.  Patient assessment, 10/28: During encounter today, patient is laying in bed, no signs of distress noted.  Pt with flat affect and depressed mood, attention to personal hygiene and grooming is fair, eye contact is good, speech is clear & coherent. Thought contents are organized and logical, and pt currently denies SI/HI/AVH or paranoia. There is no evidence of delusional thoughts.  He reports energy level is very low today, rates his energy as a 3, 10 being worst.  Rates his mood as 5, 10 being best.    Reports feelings of lethargy today, states that he feels as though it is related to him taking trazodone last night, states that he feels groggy, clinical research associate educated that trazodone will be discontinued, to ascertain if his grogginess is related to the trazodone.  Patient verbalized understanding, and is in agreement with this.  We talked about the need to increase fluid intake, to which patient is agreeable.  He reports that he is eating well.  Patient reports that he also began taking Abilify 3 days ago, but the trazodone is most recent and that he only began taking it last night.  He reports that the Abilify is helpful, and that the medication has helped to render the voices that he hears muffled, and barely audible at this point, which is an improvement considering how disruptive the voices where prior to him presenting to the unit.  Denies paranoia.   He denies visual hallucinations.  Denies first rank symptoms.  Denies cravings for substances of abuse at this point.  Reports sleep is fair, denies pain, denies any other concerns.    Reports that he is awaiting rehabilitation.  Per social work,Patient has been referred to [facility], [address] [place] [address] [place].  Hopefully, patient should be able to get into a program by the end of this week, as his projected discharge date is 10/31.  Labs reviewed: TSH is 7.198, we will complete free T4 and free T3.  Medication adjustment for today: Discontinuing trazodone due to lethardy. Adding Prozac 10 mg for depressive symptoms and GAD. Adding melatonin 5 mg for sleel.  PHQ 2-9:  Flowsheet Row ED from 08/07/2024 in [facility]  Thoughts that you would be better off dead, or of hurting yourself in some way Several days  PHQ-9 Total Score 10    Flowsheet Row ED from 08/07/2024 in [facility] ED from 08/06/2024 in [facility] ED from 07/06/2024 in [facility] Emergency Department at [facility]  C-SSRS RISK CATEGORY Error: Q3, 4, or 5 should not be populated when Q2 is No High Risk No Risk      Total Time spent with patient: 45 minutes  Musculoskeletal  Strength & Muscle Tone: within normal limits Gait & Station: normal Patient leans: N/A  Psychiatric Specialty Exam  Presentation General Appearance:  Casual  Eye Contact: Fair  Speech: Clear and Coherent  Speech Volume: Normal  Handedness: Right   Mood and Affect  Mood: Depressed; Anxious  Affect: Congruent   Thought Process  Thought Processes: Coherent  Descriptions of Associations:Intact  Orientation:Full (Time, Place and Person)  Thought Content:Logical  Diagnosis of Schizophrenia or Schizoaffective disorder in past: No   Hallucinations:Hallucinations: Auditory; None Description of Auditory Hallucinations:  things  aren't going to get better  Ideas of Reference:None  Suicidal Thoughts:Suicidal Thoughts: No SI Passive Intent and/or Plan: Without Intent  Homicidal Thoughts:Homicidal Thoughts: No   Sensorium  Memory: Immediate Fair  Judgment: Fair  Insight: Fair   Chartered Certified Accountant: Fair  Attention Span: Fair  Recall: [employer] of Knowledge: Fair  Language: Fair   Psychomotor Activity  Psychomotor Activity: Psychomotor Activity: Decreased    Assets  Assets: Communication Skills; Desire for Improvement; Resilience   Sleep  Sleep: Sleep: Fair   Nutritional Assessment (For OBS and FBC admissions only) Has the patient had a weight loss or gain of 10 pounds or more in the last 3 months?: No Has the patient had a decrease in food intake/or appetite?: No Does the patient have dental problems?: No Does the patient have eating habits or behaviors that may be indicators of an eating disorder including binging or inducing vomiting?: No Has the patient recently lost weight without trying?: 0 Has the patient been eating poorly because of a decreased appetite?: 0 Malnutrition Screening Tool Score: 0   Physical Exam Constitutional:      Appearance: Normal appearance. He is not ill-appearing, toxic-appearing or diaphoretic.  Pulmonary:     Effort: Pulmonary effort is normal.  Musculoskeletal:        General: Normal range of motion.  Neurological:     Mental Status: He is alert.     Review of Systems  Constitutional:  Negative for chills and fever.  Respiratory:  Negative for cough.   Gastrointestinal:  Negative for nausea and vomiting.  Psychiatric/Behavioral:  Positive for depression, substance abuse and suicidal ideas. The patient is not nervous/anxious and does not have insomnia.    Blood pressure (!) 125/91, pulse 79, temperature 98.1 F (36.7 C), temperature source Oral, resp. rate 18, SpO2 98%. There is no height or weight on file to  calculate BMI.   Past Psychiatric History: hypermania per pt report, adjustment disorder, anxiety   Is the patient at risk to self? Yes  Has the patient been a risk to self in the past 6 months? Yes .    Has the patient been a risk to self within the distant past? No   Is the patient a risk to others? No   Has the patient been a risk to others in the past 6 months? No   Has the patient been a risk to others within the distant past? No   Past Medical History: HIV, hypothyroidism, Family History: None disclosed  Family Psychiatric History: None disclosed  Social History: Patient currently homeless and unemployed.  Was previously living with family members in setting of housing instability.  Originally from [place]  and Father nearby for support.   Last Labs:  Admission on 08/06/2024, Discharged on 08/07/2024  Component Date Value Ref Range Status   WBC 08/06/2024 9.5  4.0 - 10.5 K/uL Final   RBC 08/06/2024 5.47  4.22 - 5.81 MIL/uL Final   Hemoglobin 08/06/2024 16.8  13.0 - 17.0 g/dL Final   HCT 89/73/7974 51.1  39.0 - 52.0 % Final   MCV 08/06/2024 93.4  80.0 - 100.0 fL Final   MCH 08/06/2024 30.7  26.0 - 34.0 pg Final  MCHC 08/06/2024 32.9  30.0 - 36.0 g/dL Final   RDW 89/73/7974 12.4  11.5 - 15.5 % Final   Platelets 08/06/2024 273  150 - 400 K/uL Final   nRBC 08/06/2024 0.0  0.0 - 0.2 % Final   Neutrophils Relative % 08/06/2024 67  % Final   Neutro Abs 08/06/2024 6.4  1.7 - 7.7 K/uL Final   Lymphocytes Relative 08/06/2024 24  % Final   Lymphs Abs 08/06/2024 2.3  0.7 - 4.0 K/uL Final   Monocytes Relative 08/06/2024 7  % Final   Monocytes Absolute 08/06/2024 0.6  0.1 - 1.0 K/uL Final   Eosinophils Relative 08/06/2024 1  % Final   Eosinophils Absolute 08/06/2024 0.1  0.0 - 0.5 K/uL Final   Basophils Relative 08/06/2024 1  % Final   Basophils Absolute 08/06/2024 0.1  0.0 - 0.1 K/uL Final   Immature Granulocytes 08/06/2024 0  % Final   Abs Immature Granulocytes 08/06/2024 0.04   0.00 - 0.07 K/uL Final   Performed at [facility], [address] [place], [place] [address]   Sodium 08/06/2024 137  135 - 145 mmol/L Final   Potassium 08/06/2024 4.3  3.5 - 5.1 mmol/L Final   Chloride 08/06/2024 100  98 - 111 mmol/L Final   CO2 08/06/2024 29  22 - 32 mmol/L Final   Glucose, Bld 08/06/2024 80  70 - 99 mg/dL Final   Glucose reference range applies only to samples taken after fasting for at least 8 hours.   BUN 08/06/2024 11  6 - 20 mg/dL Final   Creatinine, Ser 08/06/2024 0.89  0.61 - 1.24 mg/dL Final   Calcium 89/73/7974 9.8  8.9 - 10.3 mg/dL Final   Total Protein 89/73/7974 7.8  6.5 - 8.1 g/dL Final   Albumin 89/73/7974 4.1  3.5 - 5.0 g/dL Final   AST 89/73/7974 24  15 - 41 U/L Final   ALT 08/06/2024 37  0 - 44 U/L Final   Alkaline Phosphatase 08/06/2024 99  38 - 126 U/L Final   Total Bilirubin 08/06/2024 0.4  0.0 - 1.2 mg/dL Final   GFR, Estimated 08/06/2024 >60  >60 mL/min Final   Comment: (NOTE) Calculated using the CKD-EPI Creatinine Equation (2021)    Anion gap 08/06/2024 8  5 - 15 Final   Performed at [facility], [address], [place], [place] [address]   Hgb A1c MFr Bld 08/06/2024 5.0  4.8 - 5.6 % Final   Comment: (NOTE) Diagnosis of Diabetes The following HbA1c ranges recommended by the American Diabetes Association (ADA) may be used as an aid in the diagnosis of diabetes mellitus.  Hemoglobin             Suggested A1C NGSP%              Diagnosis  <5.7                   Non Diabetic  5.7-6.4                Pre-Diabetic  >6.4                   Diabetic  <7.0                   Glycemic control for                       adults with diabetes.     Mean Plasma  Glucose 08/06/2024 96.8  mg/dL Final   Performed at [facility], [address], [place], [place] [address]   Alcohol, Ethyl (B) 08/06/2024 <15  <15 mg/dL Final   Comment: (NOTE) For medical purposes only. Performed at [facility], [address],  [place], [place] [address]    Cholesterol 08/06/2024 154  0 - 200 mg/dL Final   Triglycerides 89/73/7974 96  <150 mg/dL Final   HDL 89/73/7974 54  >40 mg/dL Final   Total CHOL/HDL Ratio 08/06/2024 2.9  RATIO Final   VLDL 08/06/2024 19  0 - 40 mg/dL Final   LDL Cholesterol 08/06/2024 81  0 - 99 mg/dL Final   Comment:        Total Cholesterol/HDL:CHD Risk Coronary Heart Disease Risk Table                     Men   Women  1/2 Average Risk   3.4   3.3  Average Risk       5.0   4.4  2 X Average Risk   9.6   7.1  3 X Average Risk  23.4   11.0        Use the calculated Patient Ratio above and the CHD Risk Table to determine the patient's CHD Risk.        ATP III CLASSIFICATION (LDL):  <100     mg/dL   Optimal  899-870  mg/dL   Near or Above                    Optimal  130-159  mg/dL   Borderline  839-810  mg/dL   High  >809     mg/dL   Very High Performed at [facility], [address] [place], [place] [address]    TSH 08/06/2024 7.198 (H)  0.350 - 4.500 uIU/mL Final   Comment: Performed by a 3rd Generation assay with a functional sensitivity of <=0.01 uIU/mL. Performed at [facility], [address] [place], [place] [address]    Color, Urine 08/06/2024 YELLOW  YELLOW Final   APPearance 08/06/2024 CLEAR  CLEAR Final   Specific Gravity, Urine 08/06/2024 1.020  1.005 - 1.030 Final   pH 08/06/2024 6.0  5.0 - 8.0 Final   Glucose, UA 08/06/2024 NEGATIVE  NEGATIVE mg/dL Final   Hgb urine dipstick 08/06/2024 NEGATIVE  NEGATIVE Final   Bilirubin Urine 08/06/2024 NEGATIVE  NEGATIVE Final   Ketones, ur 08/06/2024 NEGATIVE  NEGATIVE mg/dL Final   Protein, ur 89/73/7974 NEGATIVE  NEGATIVE mg/dL Final   Nitrite 89/73/7974 NEGATIVE  NEGATIVE Final   Leukocytes,Ua 08/06/2024 NEGATIVE  NEGATIVE Final   Performed at [facility], [address], [place], [place] [address]   POC Amphetamine UR 08/06/2024 Positive (A)  NONE DETECTED (Cut Off Level 1000 ng/mL) Final   POC  Secobarbital (BAR) 08/06/2024 None Detected  NONE DETECTED (Cut Off Level 300 ng/mL) Final   POC Buprenorphine (BUP) 08/06/2024 None Detected  NONE DETECTED (Cut Off Level 10 ng/mL) Final   POC Oxazepam (BZO) 08/06/2024 None Detected  NONE DETECTED (Cut Off Level 300 ng/mL) Final   POC Cocaine UR 08/06/2024 None Detected  NONE DETECTED (Cut Off Level 300 ng/mL) Final   POC Methamphetamine UR 08/06/2024 Positive (A)  NONE DETECTED (Cut Off Level 1000 ng/mL) Final   POC Morphine 08/06/2024 None Detected  NONE DETECTED (Cut Off Level 300 ng/mL) Final   POC Methadone  UR 08/06/2024 None Detected  NONE DETECTED (Cut Off Level 300 ng/mL) Final   POC Oxycodone UR 08/06/2024 None Detected  NONE DETECTED (Cut Off Level 100 ng/mL) Final   POC Marijuana UR 08/06/2024 None Detected  NONE DETECTED (Cut Off Level 50 ng/mL) Final    Allergies: Acetaminophen-codeine, Codeine, Hydrocodone, and Hydrocodone-acetaminophen  Medications:  Facility Ordered Medications  Medication   acetaminophen (TYLENOL) tablet 650 mg   alum & mag hydroxide-simeth (MAALOX/MYLANTA) 200-200-20 MG/5ML suspension 30 mL   haloperidol (HALDOL) tablet 5 mg   And   diphenhydrAMINE (BENADRYL) capsule 50 mg   haloperidol lactate (HALDOL) injection 5 mg   And   diphenhydrAMINE (BENADRYL) injection 50 mg   And   LORazepam (ATIVAN) injection 2 mg   haloperidol lactate (HALDOL) injection 10 mg   And   diphenhydrAMINE (BENADRYL) injection 50 mg   And   LORazepam (ATIVAN) injection 2 mg   ARIPiprazole (ABILIFY) tablet 5 mg   emtricitabine-tenofovir AF (DESCOVY) 200-25 MG per tablet 1 tablet   magnesium hydroxide (MILK OF MAGNESIA) suspension 30 mL   hydrOXYzine (ATARAX) tablet 25 mg   levothyroxine (SYNTHROID) tablet 50 mcg   melatonin tablet 5 mg   FLUoxetine (PROZAC) capsule 10 mg   PTA Medications  Medication Sig   MAGNESIUM GLYCINATE PO Take 1 capsule by mouth daily. (Patient not taking: Reported on 08/07/2024)   hydrOXYzine  (ATARAX) 25 MG tablet Take 25 mg by mouth at bedtime. (Patient not taking: Reported on 08/07/2024)   levothyroxine (SYNTHROID) 50 MCG tablet Take 50 mcg by mouth daily. (Patient not taking: Reported on 08/07/2024)   emtricitabine-tenofovir AF (DESCOVY) 200-25 MG tablet Take 1 tablet by mouth daily.   ARIPiprazole (ABILIFY) 5 MG tablet Take 1 tablet (5 mg total) by mouth at bedtime.    Long Term Goals: Improvement in symptoms so as ready for discharge  Short Term Goals: Patient will verbalize feelings in meetings with treatment team members., Patient will score a low risk of violence for 24 hours prior to discharge, and Patient will take medications as prescribed daily.  Medical Decision Making  Darren Melendez is a 33 y.o. male with a past reported history of adjustment disorder, self reported hypermania, and anxiety who presented voluntarily to [facility] accompanied bu his father for worsening depression and suicidal ideations.    On assessment, patient presenting with symptoms concerning for worsening depression and intermittent suicidal ideations.  Patient reports a recent overdose attempt approximately 3 months ago with undisclosed amount of poisoning.  Given patient currently not on any psychotropic medications to help address mood symptoms, no current outpatient psychiatric follow-up or therapeutic services recommend inpatient psychiatric treatment for symptomatic stabilization and medication management.  Will restart Abilify 5 mg for mood stabilization and patient's description of auditory hallucinations versus negative ruminations. Also given patient's history of marijuana use and positive UDS of methamphetamines, patient is interested in available resources to help with sobriety regarding substance use.  Patient denies substance treatment at a facility or residential treatment previously. Patient amenable with placement in facility based crisis unit given ongoing depressive symptoms and  desire for residential treatment to help maintain sobriety from substances.   Recommendations/Treatment Plan:  Based on my evaluation the patient does not appear to have an emergency medical condition.  -Start Prozac 10 mg daily for depressive symptoms/GAD -Start melatonin 5 mg nightly for sleep -Discontinue trazodone 50 mg nightly due to lethargy - Continue Abilify 5 mg at bedtime for psychosis/mood stabilization -Continue home medications for  chronic medical conditions Levothyroxine 50 mcg daily hypothyroid  Discovey daily for HIV  -- PRN agitation protocol and additional supportive meds, refer to [facility] for details  -- SW to help with residential treatment referral   Labs: Ordered free T4 and free T3  Total Time Spent in Direct Patient Care:  I personally spent 45 minutes on the unit in direct patient care. The direct patient care time included face-to-face time with the patient, reviewing the patient's chart, communicating with other professionals, and coordinating care. Greater than 50% of this time was spent in counseling or coordinating care with the patient regarding goals of hospitalization, psycho-education, and discharge planning needs.    Donia Snell, NP 08/08/24  11:36 AM

## 2024-08-08 NOTE — ED Notes (Signed)
 Paitent provided lunch.

## 2024-08-08 NOTE — ED Notes (Signed)
 Patient sleeping in the bedroom composed, NAD. Q15 mins checks in place. Will monitor for safety.

## 2024-08-08 NOTE — ED Notes (Signed)
 Pt provided with lunch, currently in dayroom attending MHT group, no distress observed.

## 2024-08-08 NOTE — ED Notes (Signed)
 Paitent provided dinner.

## 2024-08-08 NOTE — Group Note (Signed)
 Group Topic: Positive Affirmations  Group Date: 08/08/2024 Start Time: 1300 End Time: 1330 Facilitators: Veverly Oddis BRAVO, NT  Department: [facility]  Number of Participants: 4  Group Focus: check in, clarity of thought, and self-awareness Treatment Modality:  Cognitive Behavioral Therapy Interventions utilized were assignment Purpose: increase insight  Name: Darren Melendez Date of Birth: 03-14-1991  MR: [id number]    Level of Participation: minimal Quality of Participation: engaged Interactions with others: Paitent had appropriate interactions by nodding his head when others shared. Paitent did not verbally share but did reflect on the thankful sheet handed out. Mood/Affect: flat Triggers (if applicable): none Cognition: coherent/clear Progress: Minimal Response: no response Plan: patient will be encouraged to share in group when he feels comfortable and encouraged to keep attending group.  Patients Problems:  Patient Active Problem List   Diagnosis Date Noted   HIV disease (HCC) 08/07/2024   Hypothyroidism 08/07/2024   Cannabis abuse 08/07/2024

## 2024-08-08 NOTE — ED Notes (Signed)
 Pt reports feeling 'so, so'. Pt denies si hi vh, does report ah, says that he is hearing vague noises- verbal contract for safety provided. Pt denies physical discomforts, affirms to have eaten breakfast. Pt denies complaints for RN to address at this time. Pt presents as somewhat depressed, but pleasant.

## 2024-08-09 DIAGNOSIS — F1510 Other stimulant abuse, uncomplicated: Secondary | ICD-10-CM | POA: Diagnosis not present

## 2024-08-09 DIAGNOSIS — F4323 Adjustment disorder with mixed anxiety and depressed mood: Secondary | ICD-10-CM | POA: Diagnosis not present

## 2024-08-09 DIAGNOSIS — F333 Major depressive disorder, recurrent, severe with psychotic symptoms: Secondary | ICD-10-CM | POA: Diagnosis not present

## 2024-08-09 DIAGNOSIS — F1220 Cannabis dependence, uncomplicated: Secondary | ICD-10-CM | POA: Diagnosis not present

## 2024-08-09 MED ORDER — FLUOXETINE HCL 20 MG PO CAPS
20.0000 mg | ORAL_CAPSULE | Freq: Every day | ORAL | Status: DC
Start: 2024-08-10 — End: 2024-08-11
  Administered 2024-08-10 – 2024-08-11 (×2): 20 mg via ORAL
  Filled 2024-08-09 (×2): qty 1

## 2024-08-09 MED ORDER — HYDROXYZINE HCL 25 MG PO TABS
50.0000 mg | ORAL_TABLET | Freq: Every day | ORAL | Status: DC
  Administered 2024-08-09 – 2024-08-10 (×2): 50 mg via ORAL
  Filled 2024-08-09 (×2): qty 2

## 2024-08-09 NOTE — Group Note (Signed)
 Group Topic: Wellness  Group Date: 08/09/2024 Start Time: 1200 End Time: 1230 Facilitators: Daved Tinnie HERO, RN  Department: [facility]  Number of Participants: 7  Group Focus: nursing group Treatment Modality:  Psychoeducation Interventions utilized were patient education Purpose: increase insight  Name: Darren Melendez Date of Birth: May 17, 1991  MR: [id number]    Level of Participation: moderate Quality of Participation: cooperative Interactions with others: gave feedback Mood/Affect: appropriate Triggers (if applicable): n/a Cognition: coherent/clear Progress: Gaining insight Response: medications reviewed with pt, questions denied.  Plan: patient will be encouraged to attend RN education groups and discuss medications as desired.   Patients Problems:  Patient Active Problem List   Diagnosis Date Noted   HIV disease (HCC) 08/07/2024   Hypothyroidism 08/07/2024   Cannabis abuse 08/07/2024

## 2024-08-09 NOTE — ED Notes (Signed)
 Pt sleeping at this moment. No acute distress noted. Respirations are even and labored. Q15 safety checks in place.

## 2024-08-09 NOTE — ED Notes (Signed)
 Pt is sleeping at this time, no acute distress noted.

## 2024-08-09 NOTE — ED Notes (Signed)
 Pt presents as depressed, but pleasant. Pt denies si hi and avh. Verbal contract for safety provided. Pt says he feels 'so, so'. Pt ate breakfast, denies physical pain and discomforts. Medications reviewed, questions denied.

## 2024-08-09 NOTE — ED Notes (Signed)
 Pt ate diner. Pt gets along well with peers but generally isolative. Pt denies concerns or complaints to RN.

## 2024-08-09 NOTE — Group Note (Signed)
 Group Topic: Understanding Self  Group Date: 08/09/2024 Start Time: 1300 End Time: 1330 Facilitators: Laneta Renea POUR, NT  Department: [facility]  Number of Participants: 2  Group Focus: coping skills and problem solving Treatment Modality:  Psychoeducation Interventions utilized were problem solving Purpose: enhance coping skills  Name: Darren Melendez Date of Birth: 1991-06-07  MR: [id number]    Did not attend group.   Patients Problems:  Patient Active Problem List   Diagnosis Date Noted   HIV disease (HCC) 08/07/2024   Hypothyroidism 08/07/2024   Cannabis abuse 08/07/2024

## 2024-08-09 NOTE — Care Management (Signed)
 [facility] Care Management...  Writer reached out to Medicaid to get update on application to transfer from Medicaid [employer] to [employer]  Per representative no update at present time. Advised to callback tomorrow  1:38 pm Writer reached out to [facility] in [place]. Left message for return call

## 2024-08-09 NOTE — ED Notes (Signed)
 Pt provided with lunch. Pt generally isolative, spending most of time in room. Pt denies concerns and complaints.

## 2024-08-09 NOTE — ED Provider Notes (Signed)
 Behavioral Health Progress Note   Date: 08/09/24 Patient Name: Darren Melendez MRN: [id number] Diagnoses:  Final diagnoses:  MDD (major depressive disorder), recurrent, severe, with psychosis (HCC)  Methamphetamine abuse (HCC)  Delta-9-tetrahydrocannabinol (THC) dependence (HCC)   HPI: Per admissions assessment: Darren Melendez is a 33 y.o. male with a past reported history of adjustment disorder, self reported hypermania, and anxiety who presented voluntarily to [facility] accompanied bu his father for worsening depression and suicidal ideations.  Patient assessment, 10/29: On assessment today, the pt reports that their mood is still continuing to improve on a day by day basis. Reports that anxiety is within manageable levels at this time. Sleep is poor last night, and Melatonin was not helpful, patient asking if he can have Hydroxyzine 50 mg nightly for sleep since Trazodone was discontinued due to feelings of grogginess. Appetite is fair, but improvement. Concentration is fair.  Energy level is fair. Denies suicidal thoughts. Denies suicidal intent and plan.  Denies having any HI.  Denies having psychotic symptoms. Denies AVH, denies paranoia and denies delusional thinking. There are no overt signs of psychosis.  Denies having side effects to current psychiatric medications.  Discussed the following psychosocial stressors: Inability to get into a rehab program at this time. Was encouraged to keep working with CSW to get into one.   Reports that he is awaiting rehabilitation.  Per social work,Patient has been referred to [facility], [address] [facility] [place] and [place], but has not been accepted into anyu of these due to his insurance type.  Hopefully, patient should be able to get into a program by the end of this week, as his projected discharge date is 11/03.  Labs reviewed: TSH is 7.198, Completed free T4 and free T3 and both are WNL. Educated to f/u with PCP after  discharge.  Medication adjustment for today: Increasing Prozac from 10 to 20 mg and adding Hydroxyzine 50 mg nightly for sleep. Requires a continuous stay due to needing to continue to monitor medications for effectiveness, or any potential side effects, and adjust as necessary.  Projected discharge date for patient is 11/3.  PHQ 2-9:  Flowsheet Row ED from 08/07/2024 in [facility]  Thoughts that you would be better off dead, or of hurting yourself in some way Several days  PHQ-9 Total Score 10    Flowsheet Row ED from 08/07/2024 in [facility] ED from 08/06/2024 in [facility] ED from 07/06/2024 in [facility] Emergency Department at [facility]  C-SSRS RISK CATEGORY No Risk High Risk No Risk      Total Time spent with patient: 45 minutes  Musculoskeletal  Strength & Muscle Tone: within normal limits Gait & Station: normal Patient leans: N/A  Psychiatric Specialty Exam  Presentation General Appearance:  Casual  Eye Contact: Fair  Speech: Clear and Coherent  Speech Volume: Normal  Handedness: Right   Mood and Affect  Mood: Anxious; Depressed  Affect: Congruent   Thought Process  Thought Processes: Coherent  Descriptions of Associations:Intact  Orientation:Full (Time, Place and Person)  Thought Content:Logical  Diagnosis of Schizophrenia or Schizoaffective disorder in past: No   Hallucinations:Hallucinations: None  Ideas of Reference:None  Suicidal Thoughts:Suicidal Thoughts: No  Homicidal Thoughts:Homicidal Thoughts: No   Sensorium  Memory: Immediate Fair  Judgment: Fair  Insight: Fair   Chartered Certified Accountant: Fair  Attention Span: Fair  Recall: [employer] of Knowledge: Fair  Language: Fair   Psychomotor  Activity  Psychomotor Activity: Psychomotor Activity: Normal    Assets  Assets: Resilience   Sleep   Sleep: Sleep: Fair   Nutritional Assessment (For OBS and FBC admissions only) Has the patient had a weight loss or gain of 10 pounds or more in the last 3 months?: No Has the patient had a decrease in food intake/or appetite?: No Does the patient have dental problems?: No Does the patient have eating habits or behaviors that may be indicators of an eating disorder including binging or inducing vomiting?: No Has the patient recently lost weight without trying?: 0 Has the patient been eating poorly because of a decreased appetite?: 0 Malnutrition Screening Tool Score: 0   Physical Exam Constitutional:      Appearance: Normal appearance. He is not ill-appearing, toxic-appearing or diaphoretic.  Pulmonary:     Effort: Pulmonary effort is normal.  Musculoskeletal:        General: Normal range of motion.  Neurological:     Mental Status: He is alert.     Review of Systems  Constitutional:  Negative for chills and fever.  Respiratory:  Negative for cough.   Gastrointestinal:  Negative for nausea and vomiting.  Psychiatric/Behavioral:  Positive for depression, substance abuse and suicidal ideas. The patient is not nervous/anxious and does not have insomnia.    Blood pressure (!) 128/100, pulse 80, temperature 97.9 F (36.6 C), resp. rate 17, SpO2 98%. There is no height or weight on file to calculate BMI.   Past Psychiatric History: hypermania per pt report, adjustment disorder, anxiety   Is the patient at risk to self? Yes  Has the patient been a risk to self in the past 6 months? Yes .    Has the patient been a risk to self within the distant past? No   Is the patient a risk to others? No   Has the patient been a risk to others in the past 6 months? No   Has the patient been a risk to others within the distant past? No   Past Medical History: HIV, hypothyroidism, Family History: None disclosed  Family Psychiatric History: None disclosed  Social History: Patient currently  homeless and unemployed.  Was previously living with family members in setting of housing instability.  Originally from [place]  and Father nearby for support.   Last Labs:  Admission on 08/07/2024  Component Date Value Ref Range Status   Free T4 08/08/2024 0.69  0.61 - 1.12 ng/dL Final   Comment: (NOTE) Biotin ingestion may interfere with free T4 tests. If the results are inconsistent with the TSH level, previous test results, or the clinical presentation, then consider biotin interference. If needed, order repeat testing after stopping biotin. Performed at [facility], [address] [place], [place] [address]   Admission on 08/06/2024, Discharged on 08/07/2024  Component Date Value Ref Range Status   WBC 08/06/2024 9.5  4.0 - 10.5 K/uL Final   RBC 08/06/2024 5.47  4.22 - 5.81 MIL/uL Final   Hemoglobin 08/06/2024 16.8  13.0 - 17.0 g/dL Final   HCT 89/73/7974 51.1  39.0 - 52.0 % Final   MCV 08/06/2024 93.4  80.0 - 100.0 fL Final   MCH 08/06/2024 30.7  26.0 - 34.0 pg Final   MCHC 08/06/2024 32.9  30.0 - 36.0 g/dL Final   RDW 89/73/7974 12.4  11.5 - 15.5 % Final   Platelets 08/06/2024 273  150 - 400 K/uL Final   nRBC 08/06/2024 0.0  0.0 -  0.2 % Final   Neutrophils Relative % 08/06/2024 67  % Final   Neutro Abs 08/06/2024 6.4  1.7 - 7.7 K/uL Final   Lymphocytes Relative 08/06/2024 24  % Final   Lymphs Abs 08/06/2024 2.3  0.7 - 4.0 K/uL Final   Monocytes Relative 08/06/2024 7  % Final   Monocytes Absolute 08/06/2024 0.6  0.1 - 1.0 K/uL Final   Eosinophils Relative 08/06/2024 1  % Final   Eosinophils Absolute 08/06/2024 0.1  0.0 - 0.5 K/uL Final   Basophils Relative 08/06/2024 1  % Final   Basophils Absolute 08/06/2024 0.1  0.0 - 0.1 K/uL Final   Immature Granulocytes 08/06/2024 0  % Final   Abs Immature Granulocytes 08/06/2024 0.04  0.00 - 0.07 K/uL Final   Performed at [facility], [address], [place], [place] [address]   Sodium 08/06/2024 137  135 - 145 mmol/L  Final   Potassium 08/06/2024 4.3  3.5 - 5.1 mmol/L Final   Chloride 08/06/2024 100  98 - 111 mmol/L Final   CO2 08/06/2024 29  22 - 32 mmol/L Final   Glucose, Bld 08/06/2024 80  70 - 99 mg/dL Final   Glucose reference range applies only to samples taken after fasting for at least 8 hours.   BUN 08/06/2024 11  6 - 20 mg/dL Final   Creatinine, Ser 08/06/2024 0.89  0.61 - 1.24 mg/dL Final   Calcium 89/73/7974 9.8  8.9 - 10.3 mg/dL Final   Total Protein 89/73/7974 7.8  6.5 - 8.1 g/dL Final   Albumin 89/73/7974 4.1  3.5 - 5.0 g/dL Final   AST 89/73/7974 24  15 - 41 U/L Final   ALT 08/06/2024 37  0 - 44 U/L Final   Alkaline Phosphatase 08/06/2024 99  38 - 126 U/L Final   Total Bilirubin 08/06/2024 0.4  0.0 - 1.2 mg/dL Final   GFR, Estimated 08/06/2024 >60  >60 mL/min Final   Comment: (NOTE) Calculated using the CKD-EPI Creatinine Equation (2021)    Anion gap 08/06/2024 8  5 - 15 Final   Performed at [facility], [address], [place], [place] [address]   Hgb A1c MFr Bld 08/06/2024 5.0  4.8 - 5.6 % Final   Comment: (NOTE) Diagnosis of Diabetes The following HbA1c ranges recommended by the American Diabetes Association (ADA) may be used as an aid in the diagnosis of diabetes mellitus.  Hemoglobin             Suggested A1C NGSP%              Diagnosis  <5.7                   Non Diabetic  5.7-6.4                Pre-Diabetic  >6.4                   Diabetic  <7.0                   Glycemic control for                       adults with diabetes.     Mean Plasma Glucose 08/06/2024 96.8  mg/dL Final   Performed at [facility], [address], [place], [place] [address]   Alcohol, Ethyl (B) 08/06/2024 <15  <15 mg/dL Final   Comment: (NOTE) For medical purposes  only. Performed at [facility], [address] [place], [place] [address]    Cholesterol 08/06/2024 154  0 - 200 mg/dL Final   Triglycerides 89/73/7974 96  <150 mg/dL Final   HDL 89/73/7974 54  >40  mg/dL Final   Total CHOL/HDL Ratio 08/06/2024 2.9  RATIO Final   VLDL 08/06/2024 19  0 - 40 mg/dL Final   LDL Cholesterol 08/06/2024 81  0 - 99 mg/dL Final   Comment:        Total Cholesterol/HDL:CHD Risk Coronary Heart Disease Risk Table                     Men   Women  1/2 Average Risk   3.4   3.3  Average Risk       5.0   4.4  2 X Average Risk   9.6   7.1  3 X Average Risk  23.4   11.0        Use the calculated Patient Ratio above and the CHD Risk Table to determine the patient's CHD Risk.        ATP III CLASSIFICATION (LDL):  <100     mg/dL   Optimal  899-870  mg/dL   Near or Above                    Optimal  130-159  mg/dL   Borderline  839-810  mg/dL   High  >809     mg/dL   Very High Performed at [facility], [address], [place], [place] [address]    TSH 08/06/2024 7.198 (H)  0.350 - 4.500 uIU/mL Final   Comment: Performed by a 3rd Generation assay with a functional sensitivity of <=0.01 uIU/mL. Performed at [facility], [address], [place], [place] [address]    Color, Urine 08/06/2024 YELLOW  YELLOW Final   APPearance 08/06/2024 CLEAR  CLEAR Final   Specific Gravity, Urine 08/06/2024 1.020  1.005 - 1.030 Final   pH 08/06/2024 6.0  5.0 - 8.0 Final   Glucose, UA 08/06/2024 NEGATIVE  NEGATIVE mg/dL Final   Hgb urine dipstick 08/06/2024 NEGATIVE  NEGATIVE Final   Bilirubin Urine 08/06/2024 NEGATIVE  NEGATIVE Final   Ketones, ur 08/06/2024 NEGATIVE  NEGATIVE mg/dL Final   Protein, ur 89/73/7974 NEGATIVE  NEGATIVE mg/dL Final   Nitrite 89/73/7974 NEGATIVE  NEGATIVE Final   Leukocytes,Ua 08/06/2024 NEGATIVE  NEGATIVE Final   Performed at [facility], [address] [place], [place] [address]   POC Amphetamine UR 08/06/2024 Positive (A)  NONE DETECTED (Cut Off Level 1000 ng/mL) Final   POC Secobarbital (BAR) 08/06/2024 None Detected  NONE DETECTED (Cut Off Level 300 ng/mL) Final   POC Buprenorphine (BUP) 08/06/2024 None Detected  NONE DETECTED  (Cut Off Level 10 ng/mL) Final   POC Oxazepam (BZO) 08/06/2024 None Detected  NONE DETECTED (Cut Off Level 300 ng/mL) Final   POC Cocaine UR 08/06/2024 None Detected  NONE DETECTED (Cut Off Level 300 ng/mL) Final   POC Methamphetamine UR 08/06/2024 Positive (A)  NONE DETECTED (Cut Off Level 1000 ng/mL) Final   POC Morphine 08/06/2024 None Detected  NONE DETECTED (Cut Off Level 300 ng/mL) Final   POC Methadone UR 08/06/2024 None Detected  NONE DETECTED (Cut Off Level 300 ng/mL) Final   POC Oxycodone UR 08/06/2024 None Detected  NONE DETECTED (Cut Off Level 100 ng/mL) Final   POC Marijuana UR 08/06/2024 None Detected  NONE DETECTED (Cut Off Level 50 ng/mL) Final    Allergies: Acetaminophen-codeine, Codeine, Hydrocodone, and Hydrocodone-acetaminophen  Medications:  Facility Ordered Medications  Medication   acetaminophen (TYLENOL) tablet 650 mg   alum & mag hydroxide-simeth (MAALOX/MYLANTA) 200-200-20 MG/5ML suspension 30 mL   haloperidol (HALDOL) tablet 5 mg   And   diphenhydrAMINE (BENADRYL) capsule 50 mg   haloperidol lactate (HALDOL) injection 5 mg   And   diphenhydrAMINE (BENADRYL) injection 50 mg   And   LORazepam (ATIVAN) injection 2 mg   haloperidol lactate (HALDOL) injection 10 mg   And   diphenhydrAMINE (BENADRYL) injection 50 mg   And   LORazepam (ATIVAN) injection 2 mg   ARIPiprazole (ABILIFY) tablet 5 mg   emtricitabine-tenofovir AF (DESCOVY) 200-25 MG per tablet 1 tablet   magnesium hydroxide (MILK OF MAGNESIA) suspension 30 mL   hydrOXYzine (ATARAX) tablet 25 mg   levothyroxine (SYNTHROID) tablet 50 mcg   melatonin tablet 5 mg   hydrOXYzine (ATARAX) tablet 50 mg   [START ON 08/10/2024] FLUoxetine (PROZAC) capsule 20 mg   PTA Medications  Medication Sig   MAGNESIUM GLYCINATE PO Take 1 capsule by mouth daily. (Patient not taking: Reported on 08/07/2024)   hydrOXYzine (ATARAX) 25 MG tablet Take 25 mg by mouth at bedtime. (Patient not taking: Reported on 08/07/2024)    levothyroxine (SYNTHROID) 50 MCG tablet Take 50 mcg by mouth daily. (Patient not taking: Reported on 08/07/2024)   emtricitabine-tenofovir AF (DESCOVY) 200-25 MG tablet Take 1 tablet by mouth daily.   ARIPiprazole (ABILIFY) 5 MG tablet Take 1 tablet (5 mg total) by mouth at bedtime.    Long Term Goals: Improvement in symptoms so as ready for discharge  Short Term Goals: Patient will verbalize feelings in meetings with treatment team members., Patient will score a low risk of violence for 24 hours prior to discharge, and Patient will take medications as prescribed daily.  Medical Decision Making  FADY STAMPS is a 33 y.o. male with a past reported history of adjustment disorder, self reported hypermania, and anxiety who presented voluntarily to [facility] accompanied bu his father for worsening depression and suicidal ideations.    On assessment, patient presenting with symptoms concerning for worsening depression and intermittent suicidal ideations.  Patient reports a recent overdose attempt approximately 3 months ago with undisclosed amount of poisoning.  Given patient currently not on any psychotropic medications to help address mood symptoms, no current outpatient psychiatric follow-up or therapeutic services recommend inpatient psychiatric treatment for symptomatic stabilization and medication management.  Will restart Abilify 5 mg for mood stabilization and patient's description of auditory hallucinations versus negative ruminations. Also given patient's history of marijuana use and positive UDS of methamphetamines, patient is interested in available resources to help with sobriety regarding substance use.  Patient denies substance treatment at a facility or residential treatment previously. Patient amenable with placement in facility based crisis unit given ongoing depressive symptoms and desire for residential treatment to help maintain sobriety from substances.    Recommendations/Treatment Plan:  Based on my evaluation the patient does not appear to have an emergency medical condition.  -Increase Prozac from 10 mg daily to 20 mg for depressive symptoms/GAD -Continue  melatonin 5 mg nightly for sleep -Start Hydroxyzine 50 mg nightly for sleep -Previously discontinued trazodone 50 mg nightly due to lethargy - Continue Abilify 5 mg at bedtime for psychosis/mood stabilization -Continue home medications for chronic medical conditions Levothyroxine 50 mcg daily hypothyroid  Discovey daily for HIV  -- PRN  agitation protocol and additional supportive meds, refer to [facility] for details  -- SW to help with residential treatment referral   Labs: Ordered free T4 and free T3  Total Time Spent in Direct Patient Care:  I personally spent 45 minutes on the unit in direct patient care. The direct patient care time included face-to-face time with the patient, reviewing the patient's chart, communicating with other professionals, and coordinating care. Greater than 50% of this time was spent in counseling or coordinating care with the patient regarding goals of hospitalization, psycho-education, and discharge planning needs.    Donia Snell, NP 08/09/24  4:22 PM

## 2024-08-09 NOTE — Care Management (Addendum)
 FBC Care Management...  Writer reached out to [facility] to follow-up on plan transfer/change.  Per Medicaid representative, no available information at present time  Writer reached out to [facility]  [address] [place] [address]  Email: [email or web address]  Phone: [phone]  Writer advised they do accept patients insurance. No referral needed.   Writer was transferred to intake person. Writer left message for return call.

## 2024-08-09 NOTE — Group Note (Signed)
 Group Topic: Relapse and Recovery  Group Date: 08/09/2024 Start Time: 2000 End Time: 2100 Facilitators: Joan Plowman B  Department: [facility]  Number of Participants: 8  Group Focus: abuse issues and coping skills Treatment Modality:  Psychoeducation Interventions utilized were problem solving and support Purpose: express feelings and relapse prevention strategies  Name: Darren Melendez Date of Birth: Dec 18, 1990  MR: [id number]    Level of Participation: active Quality of Participation: attentive and cooperative Interactions with others: gave feedback Mood/Affect: appropriate Triggers (if applicable): NA Cognition: coherent/clear Progress: Gaining insight Response: NA Plan: patient will be encouraged to keep going to group  Patients Problems:  Patient Active Problem List   Diagnosis Date Noted   HIV disease (HCC) 08/07/2024   Hypothyroidism 08/07/2024   Cannabis abuse 08/07/2024

## 2024-08-10 DIAGNOSIS — F1510 Other stimulant abuse, uncomplicated: Secondary | ICD-10-CM | POA: Diagnosis not present

## 2024-08-10 DIAGNOSIS — F1220 Cannabis dependence, uncomplicated: Secondary | ICD-10-CM | POA: Diagnosis not present

## 2024-08-10 DIAGNOSIS — F333 Major depressive disorder, recurrent, severe with psychotic symptoms: Secondary | ICD-10-CM | POA: Diagnosis not present

## 2024-08-10 DIAGNOSIS — F4323 Adjustment disorder with mixed anxiety and depressed mood: Secondary | ICD-10-CM | POA: Diagnosis not present

## 2024-08-10 LAB — T3, FREE: T3, Free: 2.4 pg/mL (ref 2.0–4.4)

## 2024-08-10 MED ORDER — FLUOXETINE HCL 20 MG PO CAPS: 20.0000 mg | ORAL_CAPSULE | Freq: Every day | ORAL | 0 refills | Status: AC

## 2024-08-10 MED ORDER — MELATONIN 5 MG PO TABS: 5.0000 mg | ORAL_TABLET | Freq: Every day | ORAL | 0 refills | Status: AC

## 2024-08-10 NOTE — Group Note (Signed)
 Group Topic: Communication  Group Date: 08/10/2024 Start Time: 1515 End Time: 1600 Facilitators: Gerome Jolly, NT  Department: [facility]  Number of Participants: 4  Group Focus: communication Treatment Modality:  Dialectical Behavioral Therapy and Spiritual Interventions utilized were exploration Purpose: explore maladaptive thinking, improve communication skills, regain self-worth, and relapse prevention strategies  Name: Darren Melendez Date of Birth: 1991-05-12  MR: [id number]    Level of Participation: minimal Quality of Participation: attentive and cooperative Interactions with others: gave feedback Mood/Affect: appropriate Triggers (if applicable): na Cognition: goal directed and logical Progress: Gaining insight Response: patient identified areas where he can improve his communication style Plan: referral / recommendations  Patients Problems:  Patient Active Problem List   Diagnosis Date Noted   HIV disease (HCC) 08/07/2024   Hypothyroidism 08/07/2024   Cannabis abuse 08/07/2024

## 2024-08-10 NOTE — Care Management (Addendum)
 FBC Care Management...  Writer reached out Medicare for update on patients transfer.  No update at this present moment   1:50 pm Writer received call from [facility] educated the patient on the requirements of the program. Patient completed the initial screening process.  Patient was advised that he needs to call number that he was provided [phone] to check on bed availability   Writer provided patient with number to [facility].  Writer encouraged patient to continually call to make contact with [facility]   2:56 pm Writer met with patient at patients request... Patient stated speaking to his parents and they are allowing him to return home.  Patient stated that his father will be picking him up.  Patient reported [address] [place] as home.  Patient stated he will follow up with his Neuro Psychiatrist   Writer will provide resources for out patient therapy

## 2024-08-10 NOTE — Group Note (Signed)
 Group Topic: Communication  Group Date: 08/10/2024 Start Time: 1220 End Time: 1240 Facilitators: Herold Lajuana NOVAK, RN  Department: [facility]  Number of Participants: 6  Group Focus: coping skills Treatment Modality:  Individual Therapy Interventions utilized were leisure development Purpose: increase insight  Name: Darren Melendez Date of Birth: Mar 20, 1991  MR: [id number]    Level of Participation:  Quality of Participation: attentive Interactions with others: gave feedback Mood/Affect: appropriate Triggers (if applicable): none identified Cognition: coherent/clear Progress: Minimal Response: I don't really have a goal right now but to take things one day at the time and learn to love myself more Plan: patient will be encouraged to remain compliant with tx plan and seek assistance for cravings or withdrawals  Patients Problems:  Patient Active Problem List   Diagnosis Date Noted   HIV disease (HCC) 08/07/2024   Hypothyroidism 08/07/2024   Cannabis abuse 08/07/2024

## 2024-08-10 NOTE — Group Note (Signed)
 Group Topic: Change and Accountability  Group Date: 08/10/2024 Start Time: 2030 End Time: 2100 Facilitators: Anice Benton LABOR, NT  Department: [facility]  Number of Participants: 2  Group Focus: feeling awareness/expression and personal responsibility Treatment Modality:  Individual Therapy Interventions utilized were group exercise Purpose: express feelings and increase insight  Name: Darren Melendez Date of Birth: [birth year]  MR: [id number]    Level of Participation: Did Not Attend  Quality of Participation: N/A Interactions with others: N/A Mood/Affect: N/A Triggers (if applicable): N/A Cognition: N/A Progress: N/A Response: N/A Plan: patient will be encouraged to attend groups   Patients Problems:  Patient Active Problem List   Diagnosis Date Noted   HIV disease (HCC) 08/07/2024   Hypothyroidism 08/07/2024   Cannabis abuse 08/07/2024

## 2024-08-10 NOTE — ED Provider Notes (Addendum)
 Behavioral Health Progress Note  Date and Time: 08/10/2024 1:48 PM Name: Darren Melendez MRN:  [id number]  Subjective: I could use more sleep  Darren Melendez 33 y.o., male admitted to [facility] on 10/27 for MDD with psychosis, THC dependence. Pt is seen face to face by this provider, consulted with Dr. Lawrnce; and chart reviewed on 08/10/24.  On evaluation Darren Melendez reports that he could use more sleep. He was observed lying in bed during this shift's assessment but responded appropriately to all questions. He is alert and oriented, calm, and cooperative.   He was noted earlier in the dayroom interacting with his peers.  He reports his mood as "okay," and his affect is appropriate. He denies auditory or visual hallucinations, suicidal ideation, and homicidal ideation.  Social Work continues to work on his discharge plan.   Diagnosis:  Final diagnoses:  MDD (major depressive disorder), recurrent, severe, with psychosis (HCC)  Methamphetamine abuse (HCC)  Delta-9-tetrahydrocannabinol (THC) dependence (HCC)    Total Time spent with patient: 45 minutes  Past Psychiatric History: MDD, Adjustment disorder with mixed anxiety and depression,GAD,  Past Medical History: Hypothyroidism, fatigue - identified during chart review.  Family History: Non-reported.  Family Psychiatric  History: Non-reported.  Social History: Patient currently homeless and unemployed. Was previously living with family members in setting of housing instability. Originally from [place]  and Father nearby for support - per chart review.   Additional Social History:                         Sleep: Fair  Appetite:  Good  Current Medications:  Current Facility-Administered Medications  Medication Dose Route Frequency Provider Last Rate Last Admin   acetaminophen (TYLENOL) tablet 650 mg  650 mg Oral Q6H PRN Rainey, Donovan, MD       alum & mag hydroxide-simeth (MAALOX/MYLANTA) 200-200-20 MG/5ML  suspension 30 mL  30 mL Oral Q4H PRN Lenard Calin, MD       ARIPiprazole (ABILIFY) tablet 5 mg  5 mg Oral QHS Rainey, Donovan, MD   5 mg at 08/09/24 2128   haloperidol (HALDOL) tablet 5 mg  5 mg Oral TID PRN Lenard Calin, MD       And   diphenhydrAMINE (BENADRYL) capsule 50 mg  50 mg Oral TID PRN Lenard Calin, MD       haloperidol lactate (HALDOL) injection 5 mg  5 mg Intramuscular TID PRN Lenard Calin, MD       And   diphenhydrAMINE (BENADRYL) injection 50 mg  50 mg Intramuscular TID PRN Lenard Calin, MD       And   LORazepam (ATIVAN) injection 2 mg  2 mg Intramuscular TID PRN Lenard Calin, MD       haloperidol lactate (HALDOL) injection 10 mg  10 mg Intramuscular TID PRN Lenard Calin, MD       And   diphenhydrAMINE (BENADRYL) injection 50 mg  50 mg Intramuscular TID PRN Lenard Calin, MD       And   LORazepam (ATIVAN) injection 2 mg  2 mg Intramuscular TID PRN Lenard Calin, MD       emtricitabine-tenofovir AF (DESCOVY) 200-25 MG per tablet 1 tablet  1 tablet Oral Daily Lenard Calin, MD   1 tablet at 08/10/24 1004   FLUoxetine (PROZAC) capsule 20 mg  20 mg Oral Daily Nkwenti, Doris, NP   20 mg at 08/10/24 1004   hydrOXYzine (ATARAX) tablet 25 mg  25 mg Oral  TID PRN Lenard Calin, MD       hydrOXYzine (ATARAX) tablet 50 mg  50 mg Oral QHS Tex Drilling, NP   50 mg at 08/09/24 2128   levothyroxine (SYNTHROID) tablet 50 mcg  50 mcg Oral Daily Rainey, Donovan, MD   50 mcg at 08/10/24 0616   magnesium hydroxide (MILK OF MAGNESIA) suspension 30 mL  30 mL Oral Daily PRN Lenard Calin, MD       melatonin tablet 5 mg  5 mg Oral QHS Nkwenti, Doris, NP   5 mg at 08/09/24 2128   Current Outpatient Medications  Medication Sig Dispense Refill   ARIPiprazole (ABILIFY) 5 MG tablet Take 1 tablet (5 mg total) by mouth at bedtime.     emtricitabine-tenofovir AF (DESCOVY) 200-25 MG tablet Take 1 tablet by mouth daily.     hydrOXYzine (ATARAX) 25 MG tablet Take 25 mg by mouth  at bedtime. (Patient not taking: Reported on 08/07/2024)     levothyroxine (SYNTHROID) 50 MCG tablet Take 50 mcg by mouth daily. (Patient not taking: Reported on 08/07/2024)     MAGNESIUM GLYCINATE PO Take 1 capsule by mouth daily. (Patient not taking: Reported on 08/07/2024)      Labs  Lab Results:  Admission on 08/07/2024  Component Date Value Ref Range Status   T3, Free 08/08/2024 2.4  2.0 - 4.4 pg/mL Final   Comment: (NOTE) Performed At: [facility] [address] [place], [place] [id number] Jennette Shorter MD Ey:[id number]    Free T4 08/08/2024 0.69  0.61 - 1.12 ng/dL Final   Comment: (NOTE) Biotin ingestion may interfere with free T4 tests. If the results are inconsistent with the TSH level, previous test results, or the clinical presentation, then consider biotin interference. If needed, order repeat testing after stopping biotin. Performed at [facility], [address], [place], [place] [address]   Admission on 08/06/2024, Discharged on 08/07/2024  Component Date Value Ref Range Status   WBC 08/06/2024 9.5  4.0 - 10.5 K/uL Final   RBC 08/06/2024 5.47  4.22 - 5.81 MIL/uL Final   Hemoglobin 08/06/2024 16.8  13.0 - 17.0 g/dL Final   HCT 89/73/7974 51.1  39.0 - 52.0 % Final   MCV 08/06/2024 93.4  80.0 - 100.0 fL Final   MCH 08/06/2024 30.7  26.0 - 34.0 pg Final   MCHC 08/06/2024 32.9  30.0 - 36.0 g/dL Final   RDW 89/73/7974 12.4  11.5 - 15.5 % Final   Platelets 08/06/2024 273  150 - 400 K/uL Final   nRBC 08/06/2024 0.0  0.0 - 0.2 % Final   Neutrophils Relative % 08/06/2024 67  % Final   Neutro Abs 08/06/2024 6.4  1.7 - 7.7 K/uL Final   Lymphocytes Relative 08/06/2024 24  % Final   Lymphs Abs 08/06/2024 2.3  0.7 - 4.0 K/uL Final   Monocytes Relative 08/06/2024 7  % Final   Monocytes Absolute 08/06/2024 0.6  0.1 - 1.0 K/uL Final   Eosinophils Relative 08/06/2024 1  % Final   Eosinophils Absolute 08/06/2024 0.1  0.0 - 0.5 K/uL Final   Basophils Relative  08/06/2024 1  % Final   Basophils Absolute 08/06/2024 0.1  0.0 - 0.1 K/uL Final   Immature Granulocytes 08/06/2024 0  % Final   Abs Immature Granulocytes 08/06/2024 0.04  0.00 - 0.07 K/uL Final   Performed at [facility], [address], [place], [place] [address]   Sodium 08/06/2024 137  135 - 145 mmol/L Final  Potassium 08/06/2024 4.3  3.5 - 5.1 mmol/L Final   Chloride 08/06/2024 100  98 - 111 mmol/L Final   CO2 08/06/2024 29  22 - 32 mmol/L Final   Glucose, Bld 08/06/2024 80  70 - 99 mg/dL Final   Glucose reference range applies only to samples taken after fasting for at least 8 hours.   BUN 08/06/2024 11  6 - 20 mg/dL Final   Creatinine, Ser 08/06/2024 0.89  0.61 - 1.24 mg/dL Final   Calcium 89/73/7974 9.8  8.9 - 10.3 mg/dL Final   Total Protein 89/73/7974 7.8  6.5 - 8.1 g/dL Final   Albumin 89/73/7974 4.1  3.5 - 5.0 g/dL Final   AST 89/73/7974 24  15 - 41 U/L Final   ALT 08/06/2024 37  0 - 44 U/L Final   Alkaline Phosphatase 08/06/2024 99  38 - 126 U/L Final   Total Bilirubin 08/06/2024 0.4  0.0 - 1.2 mg/dL Final   GFR, Estimated 08/06/2024 >60  >60 mL/min Final   Comment: (NOTE) Calculated using the CKD-EPI Creatinine Equation (2021)    Anion gap 08/06/2024 8  5 - 15 Final   Performed at [facility], [address] [place], [place] [address]   Hgb A1c MFr Bld 08/06/2024 5.0  4.8 - 5.6 % Final   Comment: (NOTE) Diagnosis of Diabetes The following HbA1c ranges recommended by the American Diabetes Association (ADA) may be used as an aid in the diagnosis of diabetes mellitus.  Hemoglobin             Suggested A1C NGSP%              Diagnosis  <5.7                   Non Diabetic  5.7-6.4                Pre-Diabetic  >6.4                   Diabetic  <7.0                   Glycemic control for                       adults with diabetes.     Mean Plasma Glucose 08/06/2024 96.8  mg/dL Final   Performed at [facility], [address]  [place], [place] [address]   Alcohol, Ethyl (B) 08/06/2024 <15  <15 mg/dL Final   Comment: (NOTE) For medical purposes only. Performed at [facility], [address] [place], [place] [address]    Cholesterol 08/06/2024 154  0 - 200 mg/dL Final   Triglycerides 89/73/7974 96  <150 mg/dL Final   HDL 89/73/7974 54  >40 mg/dL Final   Total CHOL/HDL Ratio 08/06/2024 2.9  RATIO Final   VLDL 08/06/2024 19  0 - 40 mg/dL Final   LDL Cholesterol 08/06/2024 81  0 - 99 mg/dL Final   Comment:        Total Cholesterol/HDL:CHD Risk Coronary Heart Disease Risk Table                     Men   Women  1/2 Average Risk   3.4   3.3  Average Risk       5.0   4.4  2 X Average Risk   9.6   7.1  3 X Average Risk  23.4   11.0        Use the calculated Patient Ratio above and the CHD Risk Table to determine the patient's CHD Risk.        ATP III CLASSIFICATION (LDL):  <100     mg/dL   Optimal  899-870  mg/dL   Near or Above                    Optimal  130-159  mg/dL   Borderline  839-810  mg/dL   High  >809     mg/dL   Very High Performed at [facility], [address], [place], [place] [address]    TSH 08/06/2024 7.198 (H)  0.350 - 4.500 uIU/mL Final   Comment: Performed by a 3rd Generation assay with a functional sensitivity of <=0.01 uIU/mL. Performed at [facility], [address], [place], [place] [address]    Color, Urine 08/06/2024 YELLOW  YELLOW Final   APPearance 08/06/2024 CLEAR  CLEAR Final   Specific Gravity, Urine 08/06/2024 1.020  1.005 - 1.030 Final   pH 08/06/2024 6.0  5.0 - 8.0 Final   Glucose, UA 08/06/2024 NEGATIVE  NEGATIVE mg/dL Final   Hgb urine dipstick 08/06/2024 NEGATIVE  NEGATIVE Final   Bilirubin Urine 08/06/2024 NEGATIVE  NEGATIVE Final   Ketones, ur 08/06/2024 NEGATIVE  NEGATIVE mg/dL Final   Protein, ur 89/73/7974 NEGATIVE  NEGATIVE mg/dL Final   Nitrite 89/73/7974 NEGATIVE  NEGATIVE Final   Leukocytes,Ua 08/06/2024 NEGATIVE  NEGATIVE Final    Performed at [facility], [address] [place], [place] [address]   POC Amphetamine UR 08/06/2024 Positive (A)  NONE DETECTED (Cut Off Level 1000 ng/mL) Final   POC Secobarbital (BAR) 08/06/2024 None Detected  NONE DETECTED (Cut Off Level 300 ng/mL) Final   POC Buprenorphine (BUP) 08/06/2024 None Detected  NONE DETECTED (Cut Off Level 10 ng/mL) Final   POC Oxazepam (BZO) 08/06/2024 None Detected  NONE DETECTED (Cut Off Level 300 ng/mL) Final   POC Cocaine UR 08/06/2024 None Detected  NONE DETECTED (Cut Off Level 300 ng/mL) Final   POC Methamphetamine UR 08/06/2024 Positive (A)  NONE DETECTED (Cut Off Level 1000 ng/mL) Final   POC Morphine 08/06/2024 None Detected  NONE DETECTED (Cut Off Level 300 ng/mL) Final   POC Methadone UR 08/06/2024 None Detected  NONE DETECTED (Cut Off Level 300 ng/mL) Final   POC Oxycodone UR 08/06/2024 None Detected  NONE DETECTED (Cut Off Level 100 ng/mL) Final   POC Marijuana UR 08/06/2024 None Detected  NONE DETECTED (Cut Off Level 50 ng/mL) Final    Blood Alcohol level:  Lab Results  Component Value Date   [facility] <15 08/06/2024    Metabolic Disorder Labs: Lab Results  Component Value Date   HGBA1C 5.0 08/06/2024   MPG 96.8 08/06/2024   No results found for: PROLACTIN Lab Results  Component Value Date   CHOL 154 08/06/2024   TRIG 96 08/06/2024   HDL 54 08/06/2024   CHOLHDL 2.9 08/06/2024   VLDL 19 08/06/2024   LDLCALC 81 08/06/2024    Therapeutic Lab Levels: No results found for: LITHIUM No results found for: VALPROATE No results found for: CBMZ  Physical Findings   PHQ2-9    Flowsheet Row ED from 08/07/2024 in [facility]  PHQ-2 Total Score 2  PHQ-9 Total Score 9   Flowsheet Row ED from 08/07/2024 in [facility] ED from 08/06/2024 in [place]  [facility] ED from 07/06/2024 in [facility] Emergency Department at [address]  C-SSRS RISK  CATEGORY No Risk High Risk No Risk     Musculoskeletal  Strength & Muscle Tone: within normal limits Gait & Station: normal Patient leans: N/A  Psychiatric Specialty Exam  Presentation  General Appearance:  Casual  Eye Contact: Fair  Speech: Clear and Coherent  Speech Volume: Normal  Handedness: Right   Mood and Affect  Mood: Anxious; Depressed  Affect: Congruent   Thought Process  Thought Processes: Coherent  Descriptions of Associations:Intact  Orientation:Full (Time, Place and Person)  Thought Content:Logical  Diagnosis of Schizophrenia or Schizoaffective disorder in past: No    Hallucinations:Hallucinations: None  Ideas of Reference:None  Suicidal Thoughts:Suicidal Thoughts: No  Homicidal Thoughts:Homicidal Thoughts: No   Sensorium  Memory: Immediate Fair  Judgment: Fair  Insight: Fair   Art Therapist  Concentration: Fair  Attention Span: Fair  Recall: Fair  Fund of Knowledge: Fair  Language: Fair   Psychomotor Activity  Psychomotor Activity: Psychomotor Activity: Normal   Assets  Assets: Resilience   Sleep  Sleep: Sleep: Fair  Estimated Sleeping Duration (Last 24 Hours): 10.50-12.25 hours  Nutritional Assessment (For OBS and FBC admissions only) Has the patient had a weight loss or gain of 10 pounds or more in the last 3 months?: No Has the patient had a decrease in food intake/or appetite?: No Does the patient have dental problems?: No Does the patient have eating habits or behaviors that may be indicators of an eating disorder including binging or inducing vomiting?: No Has the patient recently lost weight without trying?: 0 Has the patient been eating poorly because of a decreased appetite?: 0 Malnutrition Screening Tool Score: 0    Physical Exam  Physical Exam Constitutional:      Appearance: Normal appearance.  HENT:     Head: Normocephalic and atraumatic.     Nose: Nose normal.      Mouth/Throat:     Pharynx: Oropharynx is clear.  Cardiovascular:     Rate and Rhythm: Normal rate.  Pulmonary:     Effort: Pulmonary effort is normal.  Musculoskeletal:        General: Normal range of motion.     Cervical back: Normal range of motion.  Neurological:     Mental Status: He is alert and oriented to person, place, and time.  Psychiatric:        Attention and Perception: Attention and perception normal.        Mood and Affect: Mood is depressed.        Speech: Speech normal.        Behavior: Behavior normal. Behavior is cooperative.        Thought Content: Thought content normal.        Cognition and Memory: Cognition normal.        Judgment: Judgment normal.    Review of Systems  Psychiatric/Behavioral:  Positive for depression and substance abuse.   All other systems reviewed and are negative.  Blood pressure (!) 123/90, pulse 79, temperature 98 F (36.7 C), resp. rate 17, SpO2 99%. There is no height or weight on file to calculate BMI.  Treatment Plan Summary: Daily contact with patient to assess and evaluate symptoms and progress in treatment and Medication management.  Meds Previously started:  -Continue Tylenol 650 mg every 6 hours PRN for mild pain -Continue Maalox 30 mg every 4 hrs PRN for indigestion -Continue Milk of Magnesia as needed  every 6 hrs for constipation -Continue Agitation protocol.   -Continue Prozac 20mg  PO Daily - Mood -Continue Melatonin 5mg  PO at bedtime - insomnia -Continue Abilify 5mg  PO QHS - Mood -Continue Atarax 25 mg PO TID PRN- Anxiety -Continue Levothyroxine 50 mcg daily hypothyroidism  Per SW: Patient requesting discharge tomorrow 08/11/2024. Patient stated talking to his parents and they are allowing him to return home. Patient will be picked up by his father and returning to [address] [place].   Pending his discharging home tomorrow to his family:Script for Abilify and Prozac x 30days, 0 refills, sent to his preferred  pharmacy.   Darren Marisal Swarey, NP 08/10/2024 1:48 PM

## 2024-08-10 NOTE — ED Notes (Signed)
 Pt sleeping in no acute distress. RR even and unlabored. Environment secured. Will continue to monitor for safety.

## 2024-08-10 NOTE — ED Notes (Signed)
 Pt is sleeping, no acute distress noted.

## 2024-08-10 NOTE — ED Notes (Signed)
 Pt sitting in dayroom eating dinner, watching television and interacting with peers. No acute distress noted. No concerns voiced. Informed pt to notify staff with any needs or assistance. Pt verbalized understanding and agreement. Will continue to monitor for safety.

## 2024-08-10 NOTE — Group Note (Signed)
 Group Topic: Healthy Self Image and Positive Change  Group Date: 08/10/2024 Start Time: 1300 End Time: 1330 Facilitators: Laneta Renea POUR, NT  Department: [facility]  Number of Participants: 1  Group Focus: coping skills Treatment Modality:  Psychoeducation Interventions utilized were problem solving Purpose: increase insight  Name: Darren Melendez Date of Birth: 08/14/1991  MR: [id number]    Did not attend group.   Patients Problems:  Patient Active Problem List   Diagnosis Date Noted   HIV disease (HCC) 08/07/2024   Hypothyroidism 08/07/2024   Cannabis abuse 08/07/2024

## 2024-08-10 NOTE — ED Notes (Signed)
 Pt is sleeping at this time, no acute distress noted. Respirations are even and unlabored. Q15 safety checks in place.

## 2024-08-10 NOTE — ED Notes (Signed)
 Patient A&Ox4. Denies intent to harm self/others when asked. Denies A/VH. Patient denies any physical complaints when asked. Pt states, I'm getting my rest for sure. I felt like I hadn't slept in months but I feel rested now. No acute distress noted. Support and encouragement provided. Routine safety checks conducted according to facility protocol. Encouraged patient to notify staff if thoughts of harm toward self or others arise. Patient verbalize understanding and agreement. Will continue to monitor for safety.

## 2024-08-10 NOTE — Discharge Instructions (Addendum)
 FBC Care Management...  Patient requesting discharge Friday 08/11/2024  Patient will be picked up by family member returning to [address] [employer] [place]  Patient stated he will follow up with his Clinical Research Associate will provide resources for out patient therapy  It is imperative that you follow through with treatment recommendations within 5-7 days from the day of discharge to mitigate further risk to your safety and overall mental well-being.  A list of outpatient therapy and psychiatric providers for medication management has been provided below to get you started in finding the right provider for you.           [facility] [address] [place], [place], [address] [phone] phone [phone] fax ([address], [place] , [place], [place], [place] Health Choice, [facility] [place], GENERAL ELECTRIC, Self-Pay)  [facility] [address] [place], [place], [address] [phone] phone (BCBS, [place]; for individuals without insurance, please call and speak to our staff)  [facility] [address] [place], [place], [address] [address],5TH FLOOR phone ([address] [employer] [address] - [place], [address], [place], [place], Friday Health Plans, [address], BCBS Healthy [place], [place], [address], [place], [place], [place], [place], [place], [facility], [facility] Federal-mogul, Eli Lilly And Company)  [employer], [facility] [address] [place], [place], [address] [phone] phone (Aetna/Aetna [place], BHS/Behavioral Health Systems, [place], [place], [address], [place], ComPsych, Health Choice, Health Net, [place], MHN/Health Net, [place], [place], [place])  [facility] [address] [place], [place], [address] [phone] phone - MUST CALL TO COMPLETE A PHONE ASSESSMENT [phone] fax PHP - 9am-3pm, 5 days/week (in-person); IOP - 9am-12pm, 3 days/week (virtual; you choose the days) (BCBS, [place],  [place], [place], [place], [place])  [facility] [address], [place], [address] [phone] phone [phone] fax (Medicare, TRICARE, call to verify other insurance coverage)  [facility] [address] [place], [place], [address] [phone] phone [phone] fax Also does DBT, Trauma Focused CBT, Trauma Groups, Support Groups (BCBS, self-pay, or scholarship)  [facility] [address] [place], [place], [address] [phone] phone Also does DBT, Radical Open DBT, EMDR, and [place] ([address], UHC, [place], Legrand [place], Cigna)  [facility] [address], [place], [address]  [phone] phone [phone] fax Also does EMDR, Child First, and trauma work (Medicaid, [place], [facility], [place], [place] Health Choice, reduced rates for self-pay)  [employer] (DBT Center) [address], [address] [place], [place], [address] [phone] phone [phone] fax  [facility] (ADULTS ONLY) [address] [place], [place], [address] [phone] phone (BCBS-[place], [place], [place], [place], [place], Friday Health, [facility])  [facility] (FEMALES ONLY; FAITH BASED) [address] [place], [place], [address] [phone] phone (Sliding fee scale, [place], [facility], [place], Alliance)  [facility] [address] [place], [place], [address] [phone] phone (Medicaid - [place], [place], and Innovations; [place], [facility], Becton[employer] Dickinson And Company, [place], [place], [place], [address] Preferred, Tricare/[place], [place])  [facility] [address]   [address] [place], [place], [address]   [place], [place], [address] [phone] phone    [phone] phone [phone] fax    [phone] fax (Call to verify insurance coverage)  [facility] [phone] [address], [address] [place], [place], [address] [phone] phone [phone] fax Also does [employer], [place],  Neuropsychiatric Testing ([address]6th [address], [place], [place], [place], Cal[employer] Corporation, Teacher, Adult Education by [employer] of [place] , [place]  [place], [facility] Health Work, Aflac Incorporated,  Progress energy, [place], MHN, [address], [place], [place])  [facility] [address] [place], [place], [address] [phone] phone Also does DBT and Addiction (BCBS, [place], United, and self-pay)  [employer]  (VALERO ENERGY ONLY) [address] [place], [place], [address] [phone] phone ([address], [address] [place], [place], Sprint Nextel Corporation, [place], Harrah's Entertainment, Advanced Micro Devices, Tricare, [place], [facility])  [facility] [address] [place] [address], [address] [place], [place], [address] [phone] phone (Call to verify insurance coverage)  [facility]rief Support [address]   [address] [place], [place], [address]  [place], [place], [address] [phone] phone   [phone] phone (Call to verify insurance coverage)  [facility] [address], [address] [place], [place] [address] [phone]  [facility] [address] [place], [place] [address] [phone]

## 2024-08-11 DIAGNOSIS — F333 Major depressive disorder, recurrent, severe with psychotic symptoms: Secondary | ICD-10-CM | POA: Diagnosis not present

## 2024-08-11 DIAGNOSIS — F1510 Other stimulant abuse, uncomplicated: Secondary | ICD-10-CM | POA: Diagnosis not present

## 2024-08-11 DIAGNOSIS — F4323 Adjustment disorder with mixed anxiety and depressed mood: Secondary | ICD-10-CM | POA: Diagnosis not present

## 2024-08-11 DIAGNOSIS — F1220 Cannabis dependence, uncomplicated: Secondary | ICD-10-CM | POA: Diagnosis not present

## 2024-08-11 NOTE — ED Provider Notes (Signed)
 FBC/OBS ASAP Discharge Summary  Date and Time: 08/11/2024 9:35 AM  Name: Darren Melendez  MRN:  [id number]   Discharge Diagnoses:  Final diagnoses:  MDD (major depressive disorder), recurrent, severe, with psychosis (HCC)  Methamphetamine abuse (HCC)  Delta-9-tetrahydrocannabinol (THC) dependence (HCC)    Subjective: It was a stay   Stay Summary: Pt presented to [facility] on 08/06/2024 Darren Melendez 33y male presents to [facility] accompanied by his father, voluntarily. 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 PT states that he needs to be admitted today, pt admits to being at risk of being homeless. PT states his whole family wanted him to come today to get help. PT shares that he doesn't really have anyone and feels stressed, pt also mentions he is starting to hear things but not now. PT states he sometimes hears you're not going anywhere type of depressive things. PT states a couple of months ago he tried to commit suicide by ingesting poison. PT denies HI, AVH currently. PT explains that this past month he has had SI but no plan/intent. PT admits to consuming a gummy (infused with legal amount of THC) yesterday.   Chart reviewed and discussed with attending psychiatrist. Dr Garvin Gaines.   On admission, pt reported a history of hypermania, adjustment disorder, anxiety, and hypothyroidism and he was brought to [facility] just to be admitted due to thoughts of self-harm and it not being an option and that if I'm not admitted I'm going to be homeless. Pt was living in a local hotel that his parents had covered the cost for 2 weeks prior to admission.  Patient reported that he had attempted to contact local shelters however was unsuccessful.  Patient endorsed passive suicidal ideation without plan or intent.  Patient disclosed a previous suicide attempt 2 to 3 months ago via indigestion of poison.  Patient did not homicidal ideation, intent, or  plan.  He endorsed AH for the past 1.5 months of degrading voices due to all the stress.  Patient identified current stressors as no job, family factors, and I do not know what to do.  He denied VH.  Patient was not currently on psychotropic medications stating that he had discontinued Abilify 7 to 8 months ago because I did not want to take it.  He denied previous inpatient psychiatric hospitalizations.  Patient endorsed motivation for treatment as 8/10 with 10 being highly motivated and identified protective factors as I am not ready to give up.  Patient was placed in the [facility] observation unit for continuous assessment to determine the clinically appropriate level of care.  Patient was subsequently admitted to the facility based crisis [facility]) voluntarily for psychiatric stabilization psychotropic medication management. Abilify 5 mg was started on 08/07/24 to address mood stabilization; Synthroid 50 mcg was started on 10/28 to address hypothyroidism (TSH 7.198 on 10/26; free T3 2.4 and free T4 0.69 on 08/08/2024). Descovy also started on 10/28.   Patient requested inpatient substance abuse treatment and was seen by [facility] Management and was referred to [facility], [facility], and [place].  Patient was declined at [facility] due to insurance, declined at [facility] due to insurance.  [facility] was contacted and advised they do not accept patient's insurance.  Patient was provided with the number for the [facility] house to call and check on bed availability.  Patient advised care management team: 08/10/2024 that he had spoken with his parents  and they are allowing him to return home and that his father will be picking him up at time of discharge to be taken to his parents residence at [address] [place], [place].  Patient attended groups focused on topics of social support, positive affirmations, wellness, understanding self, relapse and recovery, communication, and  change in accountability with the purpose to allow patient to express feelings, increase insight and explore return to use prevention strategies.  Pt is seen face-to-face on the [facility]) unit this morning prior to discharge. Pt is alert & oriented x 4 and engages in evaluation.  Patient advises that his parents are allowing him to return back to their residence.  Patient describes stay on [facility] as it was a stay.  He reports being adherent with current medications and denies any adverse effects from medications.  During this admission the patient was evaluated by the psychiatric team and started on Abilify, Prozac, and Synthroid for management of depression and hypothyroidism.  The patient tolerated all medications well with no reported side effects.  Over the course of stay on FBC, mood and affect gradually improved.  The patient attended group sessions, participated appropriately and was receptive to treatment interventions.  Throughout the [facility] stay, the patient remained behaviorally stable, denies suicidal homicidal ideation, intent, or plan, and exhibited no signs of psychosis or acute distress.  Sleep, appetite, and overall energy improved.  Patient has been calm, cooperative and demonstrates insight into his discharge plan as noted below.  At time of discharge patient denies suicidal or homicidal ideation, intent, or plan.  Denies AVH.  Total Time spent with patient: 10 minutes  Past Psychiatric History: hypermania per pt report, adjustment disorder, anxiety  Past Medical History: Hypothyroidism   Family History: Mother: HTN; Maternal GM: breast cancer Family Psychiatric History: none reported   Social History: Single,. Housing unstable. 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    Tobacco Cessation:  A prescription for an FDA-approved tobacco cessation medication was offered at discharge and the patient refused  Current Medications:  Current  Facility-Administered Medications  Medication Dose Route Frequency Provider Last Rate Last Admin   acetaminophen (TYLENOL) tablet 650 mg  650 mg Oral Q6H PRN Rainey, Donovan, MD       alum & mag hydroxide-simeth (MAALOX/MYLANTA) 200-200-20 MG/5ML suspension 30 mL  30 mL Oral Q4H PRN Lenard Calin, MD       ARIPiprazole (ABILIFY) tablet 5 mg  5 mg Oral QHS Lenard Calin, MD   5 mg at 08/10/24 2110   haloperidol (HALDOL) tablet 5 mg  5 mg Oral TID PRN Lenard Calin, MD       And   diphenhydrAMINE (BENADRYL) capsule 50 mg  50 mg Oral TID PRN Lenard Calin, MD       haloperidol lactate (HALDOL) injection 5 mg  5 mg Intramuscular TID PRN Lenard Calin, MD       And   diphenhydrAMINE (BENADRYL) injection 50 mg  50 mg Intramuscular TID PRN Lenard Calin, MD       And   LORazepam (ATIVAN) injection 2 mg  2 mg Intramuscular TID PRN Lenard Calin, MD       haloperidol lactate (HALDOL) injection 10 mg  10 mg Intramuscular TID PRN Lenard Calin, MD       And   diphenhydrAMINE (BENADRYL) injection 50 mg  50 mg Intramuscular TID PRN Lenard Calin, MD       And   LORazepam (ATIVAN) injection 2  mg  2 mg Intramuscular TID PRN Lenard Calin, MD       emtricitabine-tenofovir AF (DESCOVY) 200-25 MG per tablet 1 tablet  1 tablet Oral Daily Lenard Calin, MD   1 tablet at 08/11/24 0934   FLUoxetine (PROZAC) capsule 20 mg  20 mg Oral Daily Nkwenti, Doris, NP   20 mg at 08/11/24 0934   hydrOXYzine (ATARAX) tablet 25 mg  25 mg Oral TID PRN Lenard Calin, MD       hydrOXYzine (ATARAX) tablet 50 mg  50 mg Oral QHS Tex Drilling, NP   50 mg at 08/10/24 2110   levothyroxine (SYNTHROID) tablet 50 mcg  50 mcg Oral Daily Rainey, Donovan, MD   50 mcg at 08/11/24 0535   magnesium hydroxide (MILK OF MAGNESIA) suspension 30 mL  30 mL Oral Daily PRN Lenard Calin, MD       melatonin tablet 5 mg  5 mg Oral QHS Nkwenti, Doris, NP   5 mg at 08/10/24 2110   Current Outpatient Medications  Medication Sig  Dispense Refill   ARIPiprazole (ABILIFY) 5 MG tablet Take 1 tablet (5 mg total) by mouth at bedtime.     emtricitabine-tenofovir AF (DESCOVY) 200-25 MG tablet Take 1 tablet by mouth daily.     FLUoxetine (PROZAC) 20 MG capsule Take 1 capsule (20 mg total) by mouth daily. 30 capsule 0   hydrOXYzine (ATARAX) 25 MG tablet Take 25 mg by mouth at bedtime. (Patient not taking: Reported on 08/07/2024)     levothyroxine (SYNTHROID) 50 MCG tablet Take 50 mcg by mouth daily. (Patient not taking: Reported on 08/07/2024)     MAGNESIUM GLYCINATE PO Take 1 capsule by mouth daily. (Patient not taking: Reported on 08/07/2024)     melatonin 5 MG TABS Take 1 tablet (5 mg total) by mouth at bedtime. 30 tablet 0    PTA Medications:  Facility Ordered Medications  Medication   acetaminophen (TYLENOL) tablet 650 mg   alum & mag hydroxide-simeth (MAALOX/MYLANTA) 200-200-20 MG/5ML suspension 30 mL   haloperidol (HALDOL) tablet 5 mg   And   diphenhydrAMINE (BENADRYL) capsule 50 mg   haloperidol lactate (HALDOL) injection 5 mg   And   diphenhydrAMINE (BENADRYL) injection 50 mg   And   LORazepam (ATIVAN) injection 2 mg   haloperidol lactate (HALDOL) injection 10 mg   And   diphenhydrAMINE (BENADRYL) injection 50 mg   And   LORazepam (ATIVAN) injection 2 mg   ARIPiprazole (ABILIFY) tablet 5 mg   emtricitabine-tenofovir AF (DESCOVY) 200-25 MG per tablet 1 tablet   magnesium hydroxide (MILK OF MAGNESIA) suspension 30 mL   hydrOXYzine (ATARAX) tablet 25 mg   levothyroxine (SYNTHROID) tablet 50 mcg   melatonin tablet 5 mg   hydrOXYzine (ATARAX) tablet 50 mg   FLUoxetine (PROZAC) capsule 20 mg   PTA Medications  Medication Sig   MAGNESIUM GLYCINATE PO Take 1 capsule by mouth daily. (Patient not taking: Reported on 08/07/2024)   hydrOXYzine (ATARAX) 25 MG tablet Take 25 mg by mouth at bedtime. (Patient not taking: Reported on 08/07/2024)   levothyroxine (SYNTHROID) 50 MCG tablet Take 50 mcg by mouth daily.  (Patient not taking: Reported on 08/07/2024)   emtricitabine-tenofovir AF (DESCOVY) 200-25 MG tablet Take 1 tablet by mouth daily.   ARIPiprazole (ABILIFY) 5 MG tablet Take 1 tablet (5 mg total) by mouth at bedtime.   FLUoxetine (PROZAC) 20 MG capsule Take 1 capsule (20 mg total) by mouth daily.   melatonin 5 MG TABS Take 1  tablet (5 mg total) by mouth at bedtime.       08/10/2024    1:29 PM 08/08/2024   11:35 AM  Depression screen PHQ 2/9  Decreased Interest 1 2  Down, Depressed, Hopeless 1 1  PHQ - 2 Score 2 3  Altered sleeping 1 1  Tired, decreased energy 1 1  Change in appetite 1 1  Feeling bad or failure about yourself  1 1  Trouble concentrating 1 1  Moving slowly or fidgety/restless 1 1  Suicidal thoughts 1 1  PHQ-9 Score 9 10  Difficult doing work/chores Somewhat difficult Somewhat difficult    Flowsheet Row ED from 08/07/2024 in [facility] ED from 08/06/2024 in [facility] ED from [facility]/06/2024 in [facility]  C-SSRS RISK CATEGORY No Risk High Risk No Risk    Musculoskeletal  Strength & Muscle Tone: within normal limits Gait & Station: normal Patient leans: N/A  Psychiatric Specialty Exam  Presentation  General Appearance:  Casual  Eye Contact: Fair  Speech: Clear and Coherent  Speech Volume: Normal  Handedness: Right   Mood and Affect  Mood: Anxious; Depressed  Affect: Congruent   Thought Process  Thought Processes: Coherent  Descriptions of Associations:Intact  Orientation:Full (Time, Place and Person)  Thought Content:Logical  Diagnosis of Schizophrenia or Schizoaffective disorder in past: No    Hallucinations:No data recorded Ideas of Reference:None  Suicidal Thoughts:No data recorded Homicidal Thoughts:No data recorded  Sensorium  Memory: Immediate Fair  Judgment: Fair  Insight: Fair   Producer, Television/film/video: Fair  Attention Span: Fair  Recall: [employer] of Knowledge: Fair  Language: Fair   Psychomotor Activity  Psychomotor Activity:No data recorded  Assets  Assets: Resilience   Sleep  Sleep:No data recorded Estimated Sleeping Duration (Last 24 Hours): 13.00-13.50 hours  No data recorded  Physical Exam  Physical Exam Vitals and nursing note reviewed.  HENT:     Head: Normocephalic.     Mouth/Throat:     Mouth: Mucous membranes are moist.  Cardiovascular:     Rate and Rhythm: Normal rate.  Pulmonary:     Effort: Pulmonary effort is normal.  Musculoskeletal:        General: Normal range of motion.     Cervical back: Normal range of motion.  Skin:    General: Skin is warm and dry.  Neurological:     Mental Status: He is alert and oriented to person, place, and time.  Psychiatric:     Comments: See HPI    Review of Systems  Constitutional:  Negative for chills and fever.  HENT:  Negative for congestion and sore throat.   Respiratory:  Negative for cough and shortness of breath.   Cardiovascular:  Negative for chest pain and palpitations.  Gastrointestinal:  Negative for diarrhea, nausea and vomiting.  Psychiatric/Behavioral:  Positive for depression. Negative for hallucinations, substance abuse and suicidal ideas.    Blood pressure 128/81, pulse 80, temperature 98.2 F (36.8 C), temperature source Oral, resp. rate 18, SpO2 100%. There is no height or weight on file to calculate BMI.  Demographic Factors:  Male, Caucasian, and Unemployed  Loss Factors: Decrease in vocational status and Financial problems/change in socioeconomic status  Historical Factors: Previous suicide attempt 2-3 months ago per pt report  Risk Reduction Factors:   Living with another person, especially a relative  Continued Clinical Symptoms:  Depression:   Anhedonia  Cognitive Features That  Contribute To Risk:  None    Suicide Risk:  Minimal: No  identifiable suicidal ideation.  Patients presenting with no risk factors but with morbid ruminations; may be classified as minimal risk based on the severity of the depressive symptoms  Plan Of Care/Follow-up recommendations:  Activity:  as tolerated Diet:  regular Tests:  to be determined by outpatient primary care provider.  Disposition: The patient will be discharged home with his parents.  Patient has been provided with the resources for outpatient psychiatric and medical providers.  He is advised of the importance of establishing a primary care provider for continued monitoring and management of hypothyroidism.  Patient states he will select an outpatient psychiatrist from the list provided to him for ongoing medication management and therapy as needed.  Patient was educated on medication adherence, the importance of follow-up, and signs of return to use or medication side effects.  The patient is aware of their discharge plan, demonstrates insight into their condition, and agrees to adhere to the recommended follow-up care. The patient's condition at discharge is stable and improved. No acute safety concerns are present at the time of discharge.  Medications - Rxs for Abilify and Prozac were sent to pharmacy of record.   Sherrell Culver, PMHNP-BC, FNP-BC  08/11/2024, 9:35 AM

## 2024-08-11 NOTE — ED Notes (Signed)
 Pt is sleeping, no acute distress noted. Q15 min safety checks continued.
